# Patient Record
Sex: Female | Born: 1970 | Race: White | Hispanic: No | Marital: Married | Smoking: Former smoker
Health system: Southern US, Community
[De-identification: ages and names within clinical notes are randomized; demographics above are authoritative.]

## PROBLEM LIST (undated history)

## (undated) DIAGNOSIS — I10 Essential (primary) hypertension: Secondary | ICD-10-CM

## (undated) DIAGNOSIS — K219 Gastro-esophageal reflux disease without esophagitis: Secondary | ICD-10-CM

## (undated) DIAGNOSIS — L509 Urticaria, unspecified: Secondary | ICD-10-CM

## (undated) HISTORY — DX: Gastro-esophageal reflux disease without esophagitis: K21.9

## (undated) HISTORY — DX: Urticaria, unspecified: L50.9

## (undated) HISTORY — DX: Essential (primary) hypertension: I10

## (undated) HISTORY — PX: BREAST BIOPSY: SHX20

---

## 1990-01-27 HISTORY — PX: LAPAROSCOPY: SHX197

## 2001-02-08 ENCOUNTER — Other Ambulatory Visit: Admission: RE | Admit: 2001-02-08 | Discharge: 2001-02-08 | Payer: Self-pay | Admitting: *Deleted

## 2003-06-09 ENCOUNTER — Other Ambulatory Visit: Admission: RE | Admit: 2003-06-09 | Discharge: 2003-06-09 | Payer: Self-pay | Admitting: Obstetrics and Gynecology

## 2005-01-09 ENCOUNTER — Other Ambulatory Visit: Admission: RE | Admit: 2005-01-09 | Discharge: 2005-01-09 | Payer: Self-pay | Admitting: Obstetrics and Gynecology

## 2005-01-27 HISTORY — PX: MYOMECTOMY: SHX85

## 2005-02-06 ENCOUNTER — Ambulatory Visit: Payer: Self-pay | Admitting: Internal Medicine

## 2005-03-20 ENCOUNTER — Ambulatory Visit: Payer: Self-pay | Admitting: Internal Medicine

## 2005-04-29 ENCOUNTER — Other Ambulatory Visit: Admission: RE | Admit: 2005-04-29 | Discharge: 2005-04-29 | Payer: Self-pay | Admitting: Obstetrics and Gynecology

## 2005-09-04 ENCOUNTER — Inpatient Hospital Stay (HOSPITAL_COMMUNITY): Admission: RE | Admit: 2005-09-04 | Discharge: 2005-09-05 | Payer: Self-pay | Admitting: Obstetrics and Gynecology

## 2005-09-18 ENCOUNTER — Ambulatory Visit: Payer: Self-pay | Admitting: Internal Medicine

## 2006-06-17 ENCOUNTER — Other Ambulatory Visit: Admission: RE | Admit: 2006-06-17 | Discharge: 2006-06-17 | Payer: Self-pay | Admitting: Obstetrics and Gynecology

## 2006-10-07 ENCOUNTER — Telehealth (INDEPENDENT_AMBULATORY_CARE_PROVIDER_SITE_OTHER): Payer: Self-pay | Admitting: *Deleted

## 2006-12-02 ENCOUNTER — Encounter (INDEPENDENT_AMBULATORY_CARE_PROVIDER_SITE_OTHER): Payer: Self-pay | Admitting: *Deleted

## 2006-12-03 ENCOUNTER — Ambulatory Visit: Payer: Self-pay | Admitting: Internal Medicine

## 2006-12-03 DIAGNOSIS — I10 Essential (primary) hypertension: Secondary | ICD-10-CM

## 2006-12-03 LAB — CONVERTED CEMR LAB
BUN: 12 mg/dL (ref 6–23)
CO2: 31 meq/L (ref 19–32)
Calcium: 9.5 mg/dL (ref 8.4–10.5)
Chloride: 100 meq/L (ref 96–112)
Creatinine, Ser: 0.9 mg/dL (ref 0.4–1.2)

## 2006-12-17 ENCOUNTER — Ambulatory Visit: Payer: Self-pay | Admitting: Internal Medicine

## 2006-12-17 LAB — CONVERTED CEMR LAB: Potassium: 4.1 meq/L (ref 3.5–5.1)

## 2007-02-25 ENCOUNTER — Ambulatory Visit: Payer: Self-pay | Admitting: Family Medicine

## 2007-02-25 ENCOUNTER — Encounter (INDEPENDENT_AMBULATORY_CARE_PROVIDER_SITE_OTHER): Payer: Self-pay | Admitting: Internal Medicine

## 2007-02-25 DIAGNOSIS — R002 Palpitations: Secondary | ICD-10-CM

## 2007-06-16 ENCOUNTER — Ambulatory Visit: Payer: Self-pay | Admitting: Family Medicine

## 2007-06-16 DIAGNOSIS — N390 Urinary tract infection, site not specified: Secondary | ICD-10-CM

## 2007-06-16 LAB — CONVERTED CEMR LAB
Beta hcg, urine, semiquantitative: NEGATIVE
Bilirubin Urine: NEGATIVE
Nitrite: NEGATIVE
Protein, U semiquant: >=300
Specific Gravity, Urine: <1.005

## 2007-06-18 ENCOUNTER — Encounter (INDEPENDENT_AMBULATORY_CARE_PROVIDER_SITE_OTHER): Payer: Self-pay | Admitting: Internal Medicine

## 2008-03-16 ENCOUNTER — Ambulatory Visit: Payer: Self-pay | Admitting: Family Medicine

## 2009-03-28 ENCOUNTER — Telehealth: Payer: Self-pay | Admitting: Internal Medicine

## 2009-04-27 ENCOUNTER — Ambulatory Visit: Payer: Self-pay | Admitting: Internal Medicine

## 2009-04-27 DIAGNOSIS — J019 Acute sinusitis, unspecified: Secondary | ICD-10-CM

## 2009-05-03 ENCOUNTER — Ambulatory Visit: Payer: Self-pay | Admitting: Internal Medicine

## 2009-05-03 DIAGNOSIS — K219 Gastro-esophageal reflux disease without esophagitis: Secondary | ICD-10-CM

## 2009-06-01 ENCOUNTER — Ambulatory Visit: Payer: Self-pay | Admitting: Internal Medicine

## 2009-06-01 DIAGNOSIS — R740 Nonspecific elevation of levels of transaminase and lactic acid dehydrogenase [LDH]: Secondary | ICD-10-CM

## 2009-06-04 LAB — CONVERTED CEMR LAB
ALT: 46 units/L — ABNORMAL HIGH (ref 0–35)
AST: 36 units/L (ref 0–37)
Albumin: 4.4 g/dL (ref 3.5–5.2)
Alkaline Phosphatase: 80 units/L (ref 39–117)
Hep A IgM: NEGATIVE
Potassium: 3.2 meq/L — ABNORMAL LOW (ref 3.5–5.1)
Total Protein: 8 g/dL (ref 6.0–8.3)

## 2009-06-07 ENCOUNTER — Encounter: Payer: Self-pay | Admitting: Internal Medicine

## 2009-07-06 ENCOUNTER — Ambulatory Visit: Payer: Self-pay | Admitting: Internal Medicine

## 2009-11-01 ENCOUNTER — Ambulatory Visit: Payer: Self-pay | Admitting: Internal Medicine

## 2009-11-01 DIAGNOSIS — J029 Acute pharyngitis, unspecified: Secondary | ICD-10-CM

## 2009-11-01 LAB — CONVERTED CEMR LAB: Rapid Strep: NEGATIVE

## 2009-11-26 ENCOUNTER — Encounter: Payer: Self-pay | Admitting: Internal Medicine

## 2009-11-26 ENCOUNTER — Telehealth: Payer: Self-pay | Admitting: Internal Medicine

## 2010-02-24 LAB — CONVERTED CEMR LAB
AST: 38 units/L — ABNORMAL HIGH (ref 0–37)
Alkaline Phosphatase: 79 units/L (ref 39–117)
BUN: 15 mg/dL (ref 6–23)
Basophils Absolute: 0.1 10*3/uL (ref 0.0–0.1)
Bilirubin, Direct: 0.2 mg/dL (ref 0.0–0.3)
Chloride: 97 meq/L (ref 96–112)
Eosinophils Absolute: 0.1 10*3/uL (ref 0.0–0.7)
GFR calc non Af Amer: 83.91 mL/min (ref >60)
Glucose, Bld: 80 mg/dL (ref 70–99)
Hemoglobin: 15.9 g/dL — ABNORMAL HIGH (ref 12.0–15.0)
Lymphocytes Relative: 14 % (ref 12.0–46.0)
Monocytes Relative: 4.6 % (ref 3.0–12.0)
Neutro Abs: 10.2 10*3/uL — ABNORMAL HIGH (ref 1.4–7.7)
Neutrophils Relative %: 80.2 % — ABNORMAL HIGH (ref 43.0–77.0)
Phosphorus: 3.7 mg/dL (ref 2.3–4.6)
Potassium: 3.3 meq/L — ABNORMAL LOW (ref 3.5–5.1)
RBC: 4.83 M/uL (ref 3.87–5.11)
RDW: 13.1 % (ref 11.5–14.6)
TSH: 2.06 microintl units/mL (ref 0.35–5.50)

## 2010-02-26 NOTE — Medication Information (Signed)
Summary: Chlorthalidone/[facility]  Chlorthalidone/[facility]   Imported By: Lanelle Bal 12/03/2009 09:10:28  _____________________________________________________________________  External Attachment:    Type:   Image     Comment:   External Document

## 2010-02-26 NOTE — Assessment & Plan Note (Signed)
Summary: SORE THROAT X 4 DAYS/SINUS/DLO   Vital Signs:  Patient profile:   40 year old female Weight:      167.25 pounds Temp:     98.3 degrees F oral Pulse rate:   68 / minute Pulse rhythm:   regular BP sitting:   138 / 82  (left arm) Cuff size:   regular  Vitals Entered By: Selena Batten Dance CMA [facility] Dull) (November 01, 2009 11:37 AM) CC: Sore throat/sinus issues   History of Present Illness: CC: ST  4 d h/o ST, bad, trouble swallowing.  Also mild cough.  + sinus drainage and pressure, congestion.  No fevers/chills, abd pain/n/v, HA.  No RN.  No drooling, no trouble opening/closing mouth.  Tried alka seltzer, zinc tablets.  maybe a bit dizzy.  no h/o allergies.  No sick contacts at home.  + sick contacts at work ([employer])  No smokers at home.  Current Medications (verified): 1)  Chlorthalidone 25 Mg  Tabs (Chlorthalidone) .... Take 1 Tablet By Mouth Once A Day 2)  Omeprazole 20 Mg Tbec (Omeprazole) .Marland Kitchen.. 1 Tab Daily On An Empty Stomach  Allergies: 1)  ! 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   Head:  no frontal or maxillary tenderness Eyes:  pupils equal, pupils round, pupils reactive to light Ears:  R ear normal and L ear normal.   Nose:  moderate inflammation Mouth:  + erythema pharynx but no exudates Neck:  supple, no masses, no thyromegaly, no carotid bruits, and no cervical lymphadenopathy.   Lungs:  normal respiratory effort, normal breath sounds, no crackles, and no wheezes.   Heart:  normal rate, regular rhythm, no murmur, and no gallop.   Abdomen:   soft, non-tender, and no masses.   Pulses:  2+ rad pulses Extremities:  no c/c/e Skin:  no rashes and no suspicious lesions.     Impression & Recommendations:  Problem # 1:  SORE THROAT (ICD-462)  sounds viral URTI, possible early sinusitis.  Main complaint is ST, treat supportively.  red flags to return discussed.  to call on Monday (day 10 of illness) if lingering for script for abx (zpack) to prevent sinusitis from developing.  Orders: Rapid Strep (29528)  Complete Medication List: 1)  Chlorthalidone 25 Mg Tabs (Chlorthalidone) .... Take 1 tablet by mouth once a day 2)  Omeprazole 20 Mg Tbec (Omeprazole) .Marland Kitchen.. 1 tab daily on an empty stomach  Patient Instructions: 1)  Looks like a viral infection.  Wash hands to prevent spreading. 2)  Push fluids, get plenty of rest, ibuprofen (motrin) for sore throat.  Suck on cold things like popsicles, or heat to soothe throat like herbal teas, consider salt water gargles. 3)  Call us Monday if stil lingering despite trying these things and getting plenty of rest over weekend. 4)  If fever >101.5, trouble swallowing or breathing or opening mouth, drooling, or other concerns, you may need to return to be seen. 5)  Try nasal saline for congestion. 6)  Pleasure to see you today, call clinic with questions.   Current Allergies (reviewed today): ! LISINOPRIL  Laboratory Results  Other Tests  Rapid Strep: negative

## 2010-02-26 NOTE — Progress Notes (Signed)
Summary: refill request for chlorthalidone  Phone Note Refill Request   Refills Requested: Medication #1:  CHLORTHALIDONE 25 MG  TABS Take 1 tablet by mouth once a day Pt is requesting that [facility], form is on your desk.  Initial call taken by: Lowella Petties CMA, AAMA,  November 26, 2009 12:37 PM  Follow-up for Phone Call        okay for 1 year Follow-up by: Cindee Salt MD,  November 26, 2009 1:55 PM  Additional Follow-up for Phone Call Additional follow up Details #1::        [facility] Additional Follow-up by: Mervin Hack CMA [facility]),  November 26, 2009 2:28 PM    Prescriptions: CHLORTHALIDONE 25 MG  TABS (CHLORTHALIDONE) Take 1 tablet by mouth once a day  #90 x 3   Entered by:   Mervin Hack CMA (AAMA)   Authorized by:   Cindee Salt MD   Signed by:   Mervin Hack CMA (AAMA) on 11/26/2009   Method used:   Handwritten   RxID:   [id number]

## 2010-02-26 NOTE — Letter (Signed)
Summary: Physical Exam Form/[place] [facility]  Physical Exam Form/[place] [facility]   Imported By: Lanelle Bal 06/13/2009 11:35:27  _____________________________________________________________________  External Attachment:    Type:   Image     Comment:   External Document

## 2010-02-26 NOTE — Assessment & Plan Note (Signed)
Summary: CPX/CLE   Vital Signs:  Patient profile:   40 year old female Weight:      159 pounds Temp:     98.7 degrees F oral Pulse rate:   106 / minute Pulse rhythm:   regular BP sitting:   122 / 86  (left arm) Cuff size:   regular  Vitals Entered By: Mervin Hack CMA [facility]) (May 03, 2009 2:22 PM) CC: adult physical   History of Present Illness: Feeling better Needs PE for foster parenting status hopes to foster children in the near future  Stopped smoking several years ago still feels her lung capacity isn't great---over the past 1.5 years limited exercise tolerance fairly sedentary though--spurts of walking regularly but nothing consistent occ cough No history of asthma  Still sees gynecologist is considering first mammo  Allergies: 1)  ! Lisinopril  Past History:  Past medical, surgical, family and social histories (including risk factors) reviewed for relevance to current acute and chronic problems.  Past Medical History: Hypertension Endometriosis GERD  [facility]  [phone]  Past Surgical History: Reviewed history from 12/09/2006 and no changes required. Laparoscopy- endometriosis (1992) Fibroids removed (2007)  Family History: Reviewed history from 12/09/2006 and no changes required. Parents healthy 2 brothers Dennie Bible GF died of throat cancer CAD in Pat and mat GM HTN and DM on dad's side No breast or colon cancer  Social History: Reviewed history from 04/27/2009 and no changes required. 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 Better with nasal strip Wears seat belt. Eyes:  Denies double vision and vision loss-1  eye. ENT:  Denies decreased hearing and ringing in ears; teeth okay--due for dentist. CV:  Complains of palpitations and shortness of breath with exertion; denies chest pain or discomfort, difficulty breathing at night, difficulty breathing while lying down, fainting, and near fainting. Resp:  Complains of cough; denies shortness of breath. GI:  Complains of indigestion; denies nausea and vomiting; lots of heartburn----uses zantac about 3 times per week but near daily symptoms. THis does help Occ voice changes. GU:  Denies dysuria and incontinence; no sexual problems. MS:  Denies joint pain and joint swelling. Derm:  Denies lesion(s) and rash. Neuro:  Denies headaches, numbness, tingling, and weakness. Psych:  Complains of anxiety; denies depression; stress--relates to being unemployed.. Heme:  Denies abnormal bruising and enlarge lymph nodes. Allergy:  Denies seasonal allergies and sneezing.  Physical Exam  General:  alert and normal appearance.   Eyes:  pupils equal, pupils round, pupils reactive to light, and no optic disk abnormalities.   Ears:  R ear normal and L ear normal.   Mouth:  no erythema, no exudates, and no lesions.   Neck:  supple, no masses, no thyromegaly, no carotid bruits, and no cervical lymphadenopathy.   Lungs:  normal respiratory effort, normal breath sounds, no crackles, and no wheezes.   Heart:  normal rate, regular rhythm, no murmur, and no gallop.   Abdomen:  soft, non-tender, and no masses.   Msk:  no joint tenderness and no joint swelling.   Pulses:  1+ in feet Extremities:  no edema Neurologic:  alert & oriented X3, strength normal in all extremities, and gait normal.   Skin:  no rashes and no suspicious lesions.   Axillary Nodes:  No palpable lymphadenopathy Psych:  normally interactive, good eye contact, not anxious appearing, and not depressed appearing.   Additional Exam:  Spirometry normal CXR normal   Impression & Recommendations:  Problem #  1:  WELL ADULT EXAM (ICD-V70.0) Assessment Comment Only generally healthy should work on fitness okay for foster parenting  Problem # 2:  COUGH (ICD-786.2) Assessment: New most likely related to GERD will start omeprazole 20mg  daily recheck if not improved Orders: CXR- 2view (CXR) Spirometry w/Graph (94010)  Problem # 3:  PALPITATIONS (ICD-785.1) Assessment: Comment Only intermittent seems benign Orders: EKG w/ Interpretation (93000)  Problem # 4:  HYPERTENSION (ICD-401.9) Assessment: Unchanged good control no changes needed  Her updated medication list for this problem includes:    Chlorthalidone 25 Mg Tabs (Chlorthalidone) .Marland Kitchen... Take 1 tablet by mouth once a day  Orders: TLB-Renal Function Panel (80069-RENAL) TLB-CBC Platelet - w/Differential (85025-CBCD) TLB-Hepatic/Liver Function Pnl (80076-HEPATIC) TLB-TSH (Thyroid Stimulating Hormone) (84443-TSH) Venipuncture (91478)  BP today: 122/86 Prior BP: 130/90 (04/27/2009)  Labs Reviewed: K+: 4.1 (12/17/2006) Creat: : 0.9 (12/03/2006)     Complete Medication List: 1)  Chlorthalidone 25 Mg Tabs (Chlorthalidone) .... Take 1 tablet by mouth once a day 2)  Amoxicillin 500 Mg Tabs (Amoxicillin) .... 2 tabs by mouth two times a day for sinus infection 3)  Omeprazole 20 Mg Tbec (Omeprazole) .Marland Kitchen.. 1 tab daily on an empty stomach  Patient Instructions: 1)  Please schedule a follow-up appointment in 6 months .  2)  call if your cough is not better in 1-2 months 3)  Start over the counter omeprazole (generic of prilosec)  Current Allergies (reviewed today): ! LISINOPRIL   EKG  Procedure date:  05/03/2009  Findings:      sinus @94  non diagnostic Q in lead III otherwise normal no change from 2009

## 2010-02-26 NOTE — Progress Notes (Signed)
Summary: Pt needs appt  Phone Note Outgoing Call Call back at [facility] Phone [phone]   Call placed by: DeShannon Katrinka Blazing CMA [facility]),  March 28, 2009 3:30 PM Call placed to: Patient Summary of Call: lmom for patient to return my call. Received E-Scribe Request for CHLORTHALIDONE 25 MG TABLET, pt has not been seen since 2009, need pt to schedule appt in order to get refill. Initial call taken by: Mervin Hack CMA [facility]),  March 28, 2009 3:31 PM  Follow-up for Phone Call        patient called back and advised that she is un-employed and that she does have a $7000.00 deductable with [employer] and she's not sure if they will cover a physical or follow-up. I advised pt to call her insurance and see what is covered and that I would refill x2. Pt states she will call insurance and then call to schedule appt here. DeShannon Smith CMA [facility])  March 28, 2009 4:36 PM   Additional Follow-up for Phone Call Additional follow up Details #1::        Let her know that we have fee reduction system if she qualifies and I will reduce the charge otherwise but I cannot continue to refill the Rx without a visit. The OV shoudl be less than $100 at worst if she still has no insurance---then I can do Rx for a year Additional Follow-up by: Cindee Salt MD,  March 28, 2009 7:20 PM    Additional Follow-up for Phone Call Additional follow up Details #2::    ok, I'll wait for patient to call back. Follow-up by: Mervin Hack CMA [facility] Dull),  March 29, 2009 9:03 AM

## 2010-02-26 NOTE — Assessment & Plan Note (Signed)
Summary: COUGH,SINUS PRESSURE,CONGESTION/   Vital Signs:  Patient profile:   40 year old female Weight:      163 pounds BMI:     27.65 Temp:     98.6 degrees F oral Pulse rate:   121 / minute Pulse rhythm:   regular BP sitting:   130 / 90  (left arm) Cuff size:   regular  Vitals Entered By: Mervin Hack CMA [facility] Dull) (April 27, 2009 3:15 PM) CC: cough/sinus   History of Present Illness: Having frontal pressure and sinus headache ears stopped up Throat is sore Some PND and nasal drainage No fever Some SOB when exerting herself  Some help with tylenol cold and sinus  No night sweats, chills No shakes  symptoms started 3-4 days ago Last cold she got sick with bronchitis after waiting has important interview next week ( she has been out of work for a year)  Allergies: 1)  ! Lisinopril  Past History:  Past medical, surgical, family and social histories (including risk factors) reviewed for relevance to current acute and chronic problems.  Past Medical History: Reviewed history from 12/03/2006 and no changes required. Hypertension Endometriosis  [facility]  [phone]  Past Surgical History: Reviewed history from 12/09/2006 and no changes required. Laparoscopy- endometriosis (1992) Fibroids removed (2007)  Family History: Reviewed history from 12/09/2006 and no changes required. Parents healthy 2 brothers Dennie Bible GF died of throat cancer CAD in [place] and mat GM HTN and DM on dad's side No breast or colon cancer  Social History: Reviewed history from 02/25/2007 and no changes required. 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  NAD Head:  no frontal or maxillary tenderness Ears:  R ear normal and L ear normal.   Nose:  moderate  inflammation slight yellow discharge from left Mouth:  slight injeciton in pharynx without exudate Neck:  supple, no masses, and no cervical lymphadenopathy.   Lungs:  normal respiratory effort, normal breath sounds, no crackles, and no wheezes.     Impression & Recommendations:  Problem # 1:  SINUSITIS - ACUTE-NOS (ICD-461.9) Assessment New  not clear if viral or bacterial but concerned about worsening--esp in view of her improtant interview will go ahead and treat  Her updated medication list for this problem includes:    Amoxicillin 500 Mg Tabs (Amoxicillin) .Marland Kitchen... 2 tabs by mouth two times a day for sinus infection  Complete Medication List: 1)  Chlorthalidone 25 Mg Tabs (Chlorthalidone) .... Take 1 tablet by mouth once a day 2)  Amoxicillin 500 Mg Tabs (Amoxicillin) .... 2 tabs by mouth two times a day for sinus infection  Patient Instructions: 1)  Please schedule a follow-up appointment as needed .  Prescriptions: AMOXICILLIN 500 MG TABS (AMOXICILLIN) 2 tabs by mouth two times a day for sinus infection  #40 x 0   Entered and Authorized by:   Cindee Salt MD   Signed by:   Cindee Salt MD on 04/27/2009   Method used:   Electronically to        [facility]  [address] (retail)       [address]       [place], [place]  [address]       Ph: [phone] or [phone]  Fax: [phone]   RxID:   [id number]   Current Allergies (reviewed today): ! LISINOPRIL

## 2010-05-17 ENCOUNTER — Encounter: Payer: Self-pay | Admitting: Internal Medicine

## 2010-05-21 ENCOUNTER — Ambulatory Visit (INDEPENDENT_AMBULATORY_CARE_PROVIDER_SITE_OTHER): Payer: 59 | Admitting: Internal Medicine

## 2010-05-21 ENCOUNTER — Encounter: Payer: Self-pay | Admitting: Internal Medicine

## 2010-05-21 VITALS — BP 130/80 | HR 80 | Temp 98.6°F | Ht 64.0 in | Wt 170.0 lb

## 2010-05-21 DIAGNOSIS — K219 Gastro-esophageal reflux disease without esophagitis: Secondary | ICD-10-CM

## 2010-05-21 DIAGNOSIS — I10 Essential (primary) hypertension: Secondary | ICD-10-CM

## 2010-05-21 DIAGNOSIS — R7301 Impaired fasting glucose: Secondary | ICD-10-CM

## 2010-05-21 DIAGNOSIS — Z0000 Encounter for general adult medical examination without abnormal findings: Secondary | ICD-10-CM

## 2010-05-21 LAB — BASIC METABOLIC PANEL
CO2: 29 mEq/L (ref 19–32)
Calcium: 9.8 mg/dL (ref 8.4–10.5)
Creatinine, Ser: 0.9 mg/dL (ref 0.4–1.2)
GFR: 73.82 mL/min (ref >60.00)
Sodium: 136 mEq/L (ref 135–145)

## 2010-05-21 LAB — HEMOGLOBIN A1C: Hgb A1c MFr Bld: 5.7 % (ref 4.6–6.5)

## 2010-05-21 NOTE — Progress Notes (Signed)
Subjective:    Patient ID: Madeline Reid, female    DOB: April 20, 1970, 40 y.o.   MRN: [id number]  HPI Got a job in October Applied for extra insurance and was denied Had a non fasting glucose of 114 LFTs were borderline--- ALT 46 (normal up to 45), AST normal. GGTP was high---non specific  BP has been okay Still on med  ON prilosec  Generally controls heartburn  Has appt with gyn in [place] [facility] that she has seen for years No mammo yet  Current outpatient prescriptions:chlorthalidone (HYGROTON) 25 MG tablet, Take 25 mg by mouth daily.  , Disp: , Rfl: ;  omeprazole (PRILOSEC) 20 MG capsule, Take 20 mg by mouth daily.  , Disp: , Rfl:   Past Medical History  Diagnosis Date  . Hypertension   . GERD (gastroesophageal reflux disease)   . Endometriosis     Past Surgical History  Procedure Date  . Laparoscopy 1992    endometriosis  . Myomectomy 2007    removed    Family History  Problem Relation Age of Onset  . Coronary artery disease Maternal Grandmother   . Coronary artery disease Paternal Grandmother   . Cancer Paternal Grandfather     throat cancer  . Colon cancer Neg Hx   . Breast cancer Neg Hx     History   Social History  . Marital Status: Married    Spouse Name: N/A    Number of Children: N/A  . Years of Education: N/A   Occupational History  . 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 Smoking status: Former Smoker    Types: Cigarettes    Quit date: 08/28/2006  . Smokeless tobacco: Not on file  . Alcohol Use: Yes  . Drug Use: Not on file  . Sexually Active: Not on file   Other Topics Concern  . 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       Weight up 11# in past year Eats poorly Does do some exercise Is foster parenting 40 year old  HENT: Negative for hearing loss, congestion,  rhinorrhea, dental problem, postnasal drip and tinnitus.        Reestablished with dentist now that she is back at work  Eyes: Negative for visual disturbance.       No diplopia or vision loss  Respiratory: Positive for shortness of breath. Negative for cough and chest tightness.        Stable DOE---occ even runs out of breath when talking Resolves quickly No real problems exercising though Cough controlled on prilosec  Cardiovascular: Negative for chest pain, palpitations and leg swelling.  Gastrointestinal: Negative for nausea, vomiting, abdominal pain, constipation and blood in stool.       Heartburn controlled unless forgets prilosec  Genitourinary: Negative for dysuria, frequency, difficulty urinating, menstrual problem and dyspareunia.  Musculoskeletal: Negative for myalgias, back pain, joint swelling and arthralgias.  Skin: Negative for rash.       No suspicious lesions  Neurological: Negative for dizziness, syncope, weakness and headaches.       Occ uncomfortable feeling in left hand--no specific hand RIght hand for mouse  Hematological: Negative for adenopathy. Does not bruise/bleed easily.  Psychiatric/Behavioral: Positive for sleep disturbance. Negative for dysphoric mood. The patient is not nervous/anxious.        Occ sleep  problems---awakens and can't get back to sleep No sig daytime somnolence       Objective:   Physical Exam  Constitutional: She is oriented to person, place, and time. She appears well-developed and well-nourished. No distress.  HENT:  Head: Normocephalic and atraumatic.  Right Ear: External ear normal.  Left Ear: External ear normal.  Mouth/Throat: Oropharynx is clear and moist. No oropharyngeal exudate.       TMs negative  Eyes: Conjunctivae and EOM are normal. Pupils are equal, round, and reactive to light.       Fundi benign  Neck: Normal range of motion. Neck supple. No thyromegaly present.  Cardiovascular: Normal rate, regular rhythm, normal  heart sounds and intact distal pulses.  Exam reveals no gallop.   No murmur heard. Pulmonary/Chest: Effort normal and breath sounds normal. No respiratory distress. She has no wheezes. She has no rales.  Abdominal: Soft. She exhibits no mass. There is no tenderness.  Musculoskeletal: Normal range of motion. She exhibits no edema and no tenderness.  Lymphadenopathy:    She has no cervical adenopathy.    She has no axillary adenopathy.  Neurological: She is alert and oriented to person, place, and time. She exhibits normal muscle tone.       No focal weakness  Skin: Skin is warm. No rash noted.       No suspicious lesions Multiple benign nevi--esp legs Small ?lipoma on mid volar left forearm  Psychiatric: She has a normal mood and affect. Her behavior is normal. Judgment and thought content normal.          Assessment & Plan:

## 2010-05-24 ENCOUNTER — Encounter: Payer: Self-pay | Admitting: Internal Medicine

## 2010-07-18 ENCOUNTER — Ambulatory Visit (INDEPENDENT_AMBULATORY_CARE_PROVIDER_SITE_OTHER): Payer: 59 | Admitting: Family Medicine

## 2010-07-18 ENCOUNTER — Encounter: Payer: Self-pay | Admitting: Family Medicine

## 2010-07-18 VITALS — BP 160/102 | HR 80 | Temp 99.0°F | Wt 160.0 lb

## 2010-07-18 DIAGNOSIS — I10 Essential (primary) hypertension: Secondary | ICD-10-CM

## 2010-07-18 NOTE — Assessment & Plan Note (Addendum)
Check BMET- the sx may be related to low K- and have pt check BP at home after voiding.  No change in meds for now.  D/w pt.  She understood.  See notes on labs when resulted.   Addendum.  K is 3.2, Cr 0.8.  Start KDur a day and recheck BMET in 1-2 weeks.  Dx 401.1  Handwritten order.

## 2010-07-18 NOTE — Progress Notes (Signed)
Hypertension:    Using medication without problems or lightheadedness: yes, see below Chest pain with exertion:no Edema:no Short of breath:no Average home BPs: elevated at home recently.   Other issues: nonspecific feeling of heaviness in legs bilaterally over the last week but no edema.   She had stopped the chlorthalidone (after working on diet and BP was low) but had some edema.  Started back on the medicine and the edema had resolved.    Meds, vitals, and allergies reviewed.   ROS: See HPI.  Otherwise negative.    GEN: nad, alert and oriented HEENT: mucous membranes moist NECK: supple w/o LA CV: rrr. PULM: ctab, no inc wob ABD: soft, +bs EXT: no edema SKIN: no acute rash

## 2010-07-18 NOTE — Patient Instructions (Signed)
You can get your results through our phone system.  Follow the instructions on the blue card. Don't change your meds.  Check your pressure at home in the morning after you get out of bed and after you urinate.  Let me know about the numbers.  Take care.

## 2010-07-19 ENCOUNTER — Other Ambulatory Visit: Payer: Self-pay | Admitting: *Deleted

## 2010-07-19 MED ORDER — POTASSIUM CHLORIDE CRYS ER 20 MEQ PO TBCR: 20.0000 meq | EXTENDED_RELEASE_TABLET | Freq: Two times a day (BID) | ORAL | Status: DC

## 2010-07-19 NOTE — Telephone Encounter (Signed)
Called patient with labs results. Rx for labs work left up front for patient to pick up. Rx for kdur sent to pharmacy. Copy of labs sent for scanning.

## 2010-09-18 ENCOUNTER — Other Ambulatory Visit: Payer: Self-pay | Admitting: Internal Medicine

## 2010-09-18 NOTE — Telephone Encounter (Signed)
rx sent to pharmacy by e-script  

## 2010-09-18 NOTE — Telephone Encounter (Signed)
This is not controlled  It should be refilled for a year

## 2010-10-21 ENCOUNTER — Encounter: Payer: Self-pay | Admitting: Internal Medicine

## 2010-10-21 ENCOUNTER — Ambulatory Visit (INDEPENDENT_AMBULATORY_CARE_PROVIDER_SITE_OTHER): Payer: 59 | Admitting: Internal Medicine

## 2010-10-21 DIAGNOSIS — J019 Acute sinusitis, unspecified: Secondary | ICD-10-CM

## 2010-10-21 DIAGNOSIS — I10 Essential (primary) hypertension: Secondary | ICD-10-CM

## 2010-10-21 DIAGNOSIS — K219 Gastro-esophageal reflux disease without esophagitis: Secondary | ICD-10-CM

## 2010-10-21 MED ORDER — AMOXICILLIN 500 MG PO TABS: 1000.0000 mg | ORAL_TABLET | Freq: Two times a day (BID) | ORAL | Status: AC

## 2010-10-21 NOTE — Progress Notes (Signed)
Subjective:    Patient ID: Madeline Reid, female    DOB: 1970-07-07, 40 y.o.   MRN: [id number]  HPI Feels a "little under the weather" Has ?sinus infection for about a week Doesn't think she has ragweed allergies Has pain in forehead and behind eyes Some congestion now and drainage Sore throat which seems to be due to drainage Dry aggravating cough--mostly at night  Using afrin bid and decongestant No fever or slight Slight SOB No ear pain but is congested  Has checked BP occ Usually 130/80---occ higher later in the day Has been intermittent with the potassium---OTC had no coating and Rx is very big  Stomach has been quiet lately Hasn't needed the prilosec lately  Current Outpatient Prescriptions on File Prior to Visit  Medication Sig Dispense Refill  . chlorthalidone (HYGROTON) 25 MG tablet TAKE 1 TABLET BY MOUTH ONCE A DAY  30 tablet  11  . omeprazole (PRILOSEC) 20 MG capsule Take 20 mg by mouth daily.        . potassium chloride SA (K-DUR,KLOR-CON) 20 MEQ tablet Take 1 tablet (20 mEq total) by mouth 2 (two) times daily.  30 tablet  5    Allergies  Allergen Reactions  . Lisinopril     Potential cough    Past Medical History  Diagnosis Date  . Hypertension   . GERD (gastroesophageal reflux disease)   . Endometriosis     Past Surgical History  Procedure Date  . Laparoscopy 1992    endometriosis  . Myomectomy 2007    removed    Family History  Problem Relation Age of Onset  . Coronary artery disease Maternal Grandmother   . Coronary artery disease Paternal Grandmother   . Cancer Paternal Grandfather     throat cancer  . Colon cancer Neg Hx   . Breast cancer Neg Hx     History   Social History  . Marital Status: Married    Spouse Name: N/A    Number of Children: N/A  . Years of Education: N/A   Occupational History  . 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 Smoking status: Former Smoker    Types: Cigarettes    Quit date:  08/28/2006  . Smokeless tobacco: Not on file  . Alcohol Use: Yes  . Drug Use: Not on file  . Sexually Active: Not on file   Other Topics Concern  . 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 No distress.  HENT:  Head: Normocephalic and atraumatic.  Right Ear: External ear normal.  Left Ear: External ear normal.  Mouth/Throat: Oropharynx is clear and moist. No oropharyngeal exudate.       No sinus tenderness Moderate nasal congestion and inflammation  Neck: Normal range of motion. Neck supple.  Cardiovascular: Normal rate, regular rhythm and normal heart sounds.  Exam reveals no gallop.   No murmur heard. Pulmonary/Chest: Effort normal and breath sounds normal. No respiratory distress. She has no wheezes. She has no rales.  Musculoskeletal: Normal range of motion. She exhibits no edema and no tenderness.  Lymphadenopathy:    She has no cervical adenopathy.  Psychiatric: She has a normal mood and affect. Her behavior is normal. Judgment and thought content normal.  Assessment & Plan:

## 2010-10-21 NOTE — Assessment & Plan Note (Signed)
Seems to be viral Discussed symptomatic rx (including limiting afrin) If worsens, start the amoxicillin (Rx given)

## 2010-10-21 NOTE — Assessment & Plan Note (Signed)
BP Readings from Last 3 Encounters:  10/21/10 144/90  07/18/10 160/102  05/21/10 130/80   Better Seems okay at home Persistent hypokalemia and doesn't tolerate potassium Will change to HCTZ/triam if still low  Has been on decongestants so probably at goal now

## 2010-10-21 NOTE — Assessment & Plan Note (Signed)
Better since on Atkin's Hasn't needed meds

## 2010-10-24 ENCOUNTER — Telehealth: Payer: Self-pay | Admitting: *Deleted

## 2010-10-24 ENCOUNTER — Encounter: Payer: Self-pay | Admitting: *Deleted

## 2010-10-24 MED ORDER — TRIAMTERENE-HCTZ 37.5-25 MG PO TABS: 1.0000 | ORAL_TABLET | Freq: Every day | ORAL | Status: DC

## 2010-10-24 NOTE — Telephone Encounter (Signed)
.  left message to have patient return my call.  

## 2010-10-24 NOTE — Telephone Encounter (Signed)
Message copied by Sueanne Margarita on Thu Oct 24, 2010 10:57 AM ------      Message from: Tillman Abide I      Created: Wed Oct 23, 2010  8:03 AM       Please call      The kidney tests are fine but the potassium is still low. Please stop the chlorthalidone (and she is not taking the potassium). Start HCTZ/triamt 25/37.5 daily. (1 year Rx). Set up met B in about 1 month to check potassium again      Liver tests show one borderline elevated--rest are normal. This is not concerning      Chol is excellent with total of 173 and LDL or bad chol of 83

## 2010-10-24 NOTE — Telephone Encounter (Signed)
Patient called back and I advised results, rx sent to pharmacy, letter mailed to patients home address with results.

## 2011-04-15 ENCOUNTER — Ambulatory Visit (INDEPENDENT_AMBULATORY_CARE_PROVIDER_SITE_OTHER): Payer: 59 | Admitting: Internal Medicine

## 2011-04-15 ENCOUNTER — Encounter: Payer: Self-pay | Admitting: Internal Medicine

## 2011-04-15 VITALS — BP 118/80 | HR 74 | Temp 98.3°F | Ht 65.0 in | Wt 144.0 lb

## 2011-04-15 DIAGNOSIS — K219 Gastro-esophageal reflux disease without esophagitis: Secondary | ICD-10-CM

## 2011-04-15 DIAGNOSIS — I10 Essential (primary) hypertension: Secondary | ICD-10-CM

## 2011-04-15 NOTE — Progress Notes (Signed)
  Subjective:    Patient ID: Madeline Reid, female    DOB: [birth year], 41 y.o.   MRN: [id number]  HPI Here for physical but is too soon  Is due for gyn exam Will discuss mammogram with [facility] gyn  Still on BP med No problems with this Checks it occ and has been fine No headaches No chest pain No SOB No edema  Exercises regularly Has lost a few pounds  No trouble with acid reflux Low carb diet seems to have helped No meds  Current Outpatient Prescriptions on File Prior to Visit  Medication Sig Dispense Refill  . triamterene-hydrochlorothiazide (MAXZIDE-25) 37.5-25 MG per tablet Take 1 each (1 tablet total) by mouth daily.  90 tablet  3    Allergies  Allergen Reactions  . Lisinopril     Potential cough    Past Medical History  Diagnosis Date  . Hypertension   . GERD (gastroesophageal reflux disease)   . Endometriosis     Past Surgical History  Procedure Date  . Laparoscopy 1992    endometriosis  . Myomectomy 2007    removed    Family History  Problem Relation Age of Onset  . Coronary artery disease Maternal Grandmother   . Coronary artery disease Paternal Grandmother   . Cancer Paternal Grandfather     throat cancer  . Colon cancer Neg Hx   . Breast cancer Neg Hx     History   Social History  . Marital Status: Married    Spouse Name: N/A    Number of Children: N/A  . Years of Education: N/A   Occupational History  . 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 Smoking status: Former Smoker    Types: Cigarettes    Quit date: 08/28/2006  . Smokeless tobacco: Never Used  . Alcohol Use: Yes  . Drug Use: Not on file  . Sexually Active: Not on file   Other Topics Concern  . Not on file   Social History Narrative   Is a  foster parentWill be formally adopting 2 children they have been fostering   Review of Systems Sleeps great Bowels are fine No urinary problems Has lost about 25# since last year     Objective:   Physical Exam  Constitutional: She appears well-developed and well-nourished. No distress.  Neck: Normal range of motion. Neck supple.  Cardiovascular: Normal rate, regular rhythm, normal heart sounds and intact distal pulses.  Exam reveals no gallop.   No murmur heard. Pulmonary/Chest: Effort normal and breath sounds normal. No respiratory distress. She has no wheezes. She has no rales.  Abdominal: Soft. There is no tenderness.  Musculoskeletal: She exhibits no edema and no tenderness.  Lymphadenopathy:    She has no cervical adenopathy.  Psychiatric: She has a normal mood and affect. Her behavior is normal. Judgment and thought content normal.          Assessment & Plan:

## 2011-04-15 NOTE — Assessment & Plan Note (Signed)
Much better with dietary changes No meds needed

## 2011-04-15 NOTE — Assessment & Plan Note (Signed)
BP Readings from Last 3 Encounters:  04/15/11 118/80  10/21/10 144/90  07/18/10 160/102   Good control Has done great with weight loss No changes needed in meds

## 2011-04-16 ENCOUNTER — Encounter: Payer: Self-pay | Admitting: *Deleted

## 2011-04-16 LAB — BASIC METABOLIC PANEL
Calcium: 10 mg/dL (ref 8.7–10.2)
Chloride: 95 mmol/L — ABNORMAL LOW (ref 97–108)
Glucose: 86 mg/dL (ref 65–99)
Potassium: 3.7 mmol/L (ref 3.5–5.2)

## 2011-04-16 LAB — CBC WITH DIFFERENTIAL/PLATELET
Basophils Absolute: 0 10*3/uL (ref 0.0–0.2)
Eosinophils Absolute: 0.1 10*3/uL (ref 0.0–0.4)
Immature Grans (Abs): 0 10*3/uL (ref 0.0–0.1)
Immature Granulocytes: 0 % (ref 0–2)
Lymphs: 24 % (ref 14–46)
MCH: 31.3 pg (ref 26.6–33.0)
MCHC: 33.3 g/dL (ref 31.5–35.7)
Neutrophils Relative %: 66 % (ref 40–74)

## 2011-04-16 LAB — HEPATIC FUNCTION PANEL
ALT: 52 IU/L — ABNORMAL HIGH (ref 0–32)
Bilirubin, Direct: 0.13 mg/dL (ref 0.00–0.40)

## 2011-04-16 LAB — TSH: TSH: 2.13 u[IU]/mL (ref 0.450–4.500)

## 2011-04-16 LAB — LIPID PANEL
Chol/HDL Ratio: 3.4 ratio units (ref 0.0–4.4)
LDL Calculated: 121 mg/dL — ABNORMAL HIGH (ref 0–99)

## 2011-11-13 ENCOUNTER — Other Ambulatory Visit: Payer: Self-pay | Admitting: Internal Medicine

## 2011-11-26 ENCOUNTER — Other Ambulatory Visit: Payer: Self-pay

## 2011-11-26 NOTE — Telephone Encounter (Signed)
Pt request refill triamterene HCTZ to [facility]. [facility]; pt notified while on phone.

## 2012-03-03 ENCOUNTER — Encounter: Payer: Self-pay | Admitting: Family Medicine

## 2012-03-03 ENCOUNTER — Ambulatory Visit (INDEPENDENT_AMBULATORY_CARE_PROVIDER_SITE_OTHER): Payer: 59 | Admitting: Family Medicine

## 2012-03-03 ENCOUNTER — Telehealth: Payer: Self-pay | Admitting: Internal Medicine

## 2012-03-03 VITALS — BP 142/96 | HR 93 | Temp 99.0°F | Ht 65.0 in | Wt 139.5 lb

## 2012-03-03 DIAGNOSIS — B9689 Other specified bacterial agents as the cause of diseases classified elsewhere: Secondary | ICD-10-CM

## 2012-03-03 DIAGNOSIS — J029 Acute pharyngitis, unspecified: Secondary | ICD-10-CM

## 2012-03-03 DIAGNOSIS — J019 Acute sinusitis, unspecified: Secondary | ICD-10-CM | POA: Insufficient documentation

## 2012-03-03 LAB — POCT RAPID STREP A (OFFICE): Rapid Strep A Screen: NEGATIVE

## 2012-03-03 MED ORDER — AMOXICILLIN-POT CLAVULANATE 875-125 MG PO TABS: 1.0000 | ORAL_TABLET | Freq: Two times a day (BID) | ORAL | Status: DC

## 2012-03-03 NOTE — Assessment & Plan Note (Signed)
With facial pain and fever and purulent nasal drainage after a cold tx with augmentin Disc symptomatic care - see instructions on AVS  Update if not starting to improve in a week or if worsening

## 2012-03-03 NOTE — Patient Instructions (Addendum)
Take augmentin for sinus infection  Use nasal saline spray for congestion  Ibuprofen may help congestion and facial pain and fever - take with food  Update if not starting to improve in a week or if worsening

## 2012-03-03 NOTE — Telephone Encounter (Signed)
Patient Information:  Caller Name: Cadynce  Phone: [phone]  Patient: Madeline Reid, Madeline Reid  Gender: Female  DOB: 05-Sep-1970  Age: 42 Years  PCP: Tillman Abide [facility])  Pregnant: No  Office Follow Up:  Does the office need to follow up with this patient?: No  Instructions For The Office: N/A   Symptoms  Reason For Call & Symptoms: Patient states she has been sick with sore throat, cough, congestion, afebrile.  Reviewed Health History In EMR: Yes  Reviewed Medications In EMR: Yes  Reviewed Allergies In EMR: Yes  Reviewed Surgeries / Procedures: Yes  Date of Onset of Symptoms: 02/25/2012  Treatments Tried: Mucinex  Treatments Tried Worked: No OB / GYN:  LMP: 02/01/2012  Guideline(s) Used:  Breathing Difficulty  Sinus Pain and Congestion  Disposition Per Guideline:   See Today or Tomorrow in Office  Reason For Disposition Reached:   Patient wants to be seen  Advice Given:  N/A  Appointment Scheduled:  03/03/2012 11:45:00 Appointment Scheduled Provider:  Roxy Manns [facility])

## 2012-03-03 NOTE — Progress Notes (Signed)
Subjective:    Patient ID: Madeline Reid, female    DOB: October 21, 1970, 42 y.o.   MRN: [id number]  HPI 42 y.o. Here with sinus symptoms and ST   Rapid strep test neg  Started last wed with a fairly severe ST -thought it was from congestion/drip and sinus problems- was mouth breathing  Got worse and then a bit better on Sunday By Monday night- much worse - with new chest congestion   Cough is non prod but rattling  Started taking mucinex on Monday night   Painful face- even in her teeth  Thinks she may have a sinus infection  Low grade fever Green nasal d/c   Patient Active Problem List  Diagnosis  . HYPERTENSION  . GERD  . NONSPEC ELEVATION OF LEVELS OF TRANSAMINASE/LDH  . Routine general medical examination at a health care facility   Past Medical History  Diagnosis Date  . Hypertension   . GERD (gastroesophageal reflux disease)   . Endometriosis    Past Surgical History  Procedure Date  . Laparoscopy 1992    endometriosis  . Myomectomy 2007    removed   History  Substance Use Topics  . Smoking status: Former Smoker    Types: Cigarettes    Quit date: 08/28/2006  . Smokeless tobacco: Never Used  . Alcohol Use: Yes     Comment: occ   Family History  Problem Relation Age of Onset  . Coronary artery disease Maternal Grandmother   . Coronary artery disease Paternal Grandmother   . Cancer Paternal Grandfather     throat cancer  . Colon cancer Neg Hx   . Breast cancer Neg Hx    Allergies  Allergen Reactions  . Lisinopril     Potential cough   Current Outpatient Prescriptions on File Prior to Visit  Medication Sig Dispense Refill  . triamterene-hydrochlorothiazide (MAXZIDE-25) 37.5-25 MG per tablet TAKE 1 TABLET BY MOUTH ONCE A DAY  90 tablet  2  . [DISCONTINUED] omeprazole (PRILOSEC) 20 MG capsule Take 20 mg by mouth daily.            Review of Systems Review of Systems  Constitutional: Negative for  appetite change,  and unexpected weight change.   ENT pos for cong and st and sinus pain  Eyes: Negative for pain and visual disturbance.  Respiratory: Negative for wheeze and shortness of breath.   Cardiovascular: Negative for cp or palpitations    Gastrointestinal: Negative for nausea, diarrhea and constipation.  Genitourinary: Negative for urgency and frequency.  Skin: Negative for pallor or rash   Neurological: Negative for weakness, light-headedness, numbness and headaches.  Hematological: Negative for adenopathy. Does not bruise/bleed easily.  Psychiatric/Behavioral: Negative for dysphoric mood. The patient is not nervous/anxious.         Objective:   Physical Exam  Constitutional: She appears well-developed and well-nourished. No distress.  HENT:  Head: Normocephalic and atraumatic.  Right Ear: External ear normal.  Left Ear: External ear normal.  Mouth/Throat: Oropharynx is clear and moist. No oropharyngeal exudate.       Nares are injected and congested   Bilateral maxillary and frontal sinus tenderness Throat- clear post nasal drip  Eyes: Conjunctivae normal and EOM are normal. Pupils are equal, round, and reactive to light. Right eye exhibits no discharge. Left eye exhibits no discharge.  Neck: Normal range of motion. Neck supple. No JVD present. No thyromegaly present.  Pulmonary/Chest: Effort normal and breath sounds normal. No respiratory distress. She has no  wheezes. She has no rales.  Lymphadenopathy:    She has no cervical adenopathy.  Neurological: She is alert. She has normal reflexes. No cranial nerve deficit.  Skin: Skin is warm and dry. No rash noted. No erythema. No pallor.  Psychiatric: She has a normal mood and affect.          Assessment & Plan:

## 2012-03-03 NOTE — Telephone Encounter (Signed)
I appreciate the help---I have a meeting today Will await Dr Royden Purl eval

## 2012-03-16 ENCOUNTER — Telehealth: Payer: Self-pay | Admitting: Internal Medicine

## 2012-03-16 NOTE — Telephone Encounter (Signed)
Patient Information:  Caller Name: Madeline Reid  Phone: [phone]  Patient: Madeline Reid, Madeline Reid  Gender: Female  DOB: [birth year]  Age: 42 Years  PCP: Tillman Abide [facility])  Pregnant: No  Office Follow Up:  Does the office need to follow up with this patient?: Yes  Instructions For The Office: Caller does not want to come in again for the same sxs as 03/03/12 and would like treatment only. PLEASE ADVISE.  RN Note:  Fullness in ears. Nasal drainage is clear with some green. Caller is able to eat, drink, void, and interact within normal limits. Caller states sxs are the same as last sinus infection on 03/03/12.  Symptoms  Reason For Call & Symptoms: Runny nose, sinus pressure, headache, top of teeth hurt. Given antibiotics on 2/5 for Sinus infection. caller finished antibiotics and sxs have returned  Reviewed Health History In EMR: Yes  Reviewed Medications In EMR: Yes  Reviewed Allergies In EMR: Yes  Reviewed Surgeries / Procedures: Yes  Date of Onset of Symptoms: 03/13/2012  Treatments Tried: day time cold and flu, allergy medication, nasal spray  Treatments Tried Worked: No OB / GYN:  LMP: 03/09/2012  Guideline(s) Used:  Sinus Pain and Congestion  Disposition Per Guideline:   Home Care  Reason For Disposition Reached:   Sinus congestion as part of a cold, present < 10 days  Advice Given:  Reassurance:   Sinus congestion is a normal part of a cold.  Usually home treatment with nasal washes can prevent an actual bacterial sinus infection.  Antibiotics are not helpful for the sinus congestion that occurs with colds.  Here is some care advice that should help.  For a Runny Nose With Profuse Discharge:  Nasal mucus and discharge helps to wash viruses and bacteria out of the nose and sinuses.  Blowing the nose is all that is needed.  If the skin around your nostrils gets irritated, apply a tiny amount of petroleum ointment to the nasal openings once or twice a day.  Medicines for a Stuffy or Runny Nose:  Most cold medicines that are available over-the-counter (OTC) are not helpful.  Antihistamines: Are only helpful if you also have nasal allergies.  If you have a very runny nose and you really think you need a medicine, you can try using a nasal decongestant for a couple days.  Hydration:  Drink plenty of liquids (6-8 glasses of water daily). If the air in your home is dry, use a cool mist humidifier  Expected Course:  Sinus congestion from viral upper respiratory infections (colds) usually lasts 5-10 days.  Occasionally a cold can worsen and turn into bacterial sinusitis. Clues to this are sinus symptoms lasting longer than 10 days, fever lasting longer than 3 days, and worsening pain. Bacterial sinusitis may need antibiotic treatment.  Call Back If:   Severe pain lasts longer than 2 hours after pain medicine  Sinus pain lasts longer than 1 day after starting treatment using nasal washes  Sinus congestion (fullness) lasts longer than 10 days  Fever lasts longer than 3 days  You become worse.  Patient Refused Recommendation:  Patient Requests Prescription  Caller does not want to come in again for the same sxs.

## 2012-03-17 MED ORDER — LEVOFLOXACIN 500 MG PO TABS: 500.0000 mg | ORAL_TABLET | Freq: Every day | ORAL | Status: DC

## 2012-03-17 NOTE — Telephone Encounter (Signed)
Spoke with patient and advised results rx sent to pharmacy by e-script  

## 2012-03-17 NOTE — Telephone Encounter (Signed)
Okay to try Rx with levofloxacin 500mg  daily since failed augmentin #10 x 0

## 2012-04-16 ENCOUNTER — Encounter: Payer: Self-pay | Admitting: Internal Medicine

## 2012-04-16 ENCOUNTER — Ambulatory Visit (INDEPENDENT_AMBULATORY_CARE_PROVIDER_SITE_OTHER): Payer: 59 | Admitting: Internal Medicine

## 2012-04-16 VITALS — BP 120/80 | HR 94 | Temp 97.5°F | Ht 65.5 in | Wt 141.0 lb

## 2012-04-16 DIAGNOSIS — Z0000 Encounter for general adult medical examination without abnormal findings: Secondary | ICD-10-CM

## 2012-04-16 DIAGNOSIS — I10 Essential (primary) hypertension: Secondary | ICD-10-CM

## 2012-04-16 DIAGNOSIS — R079 Chest pain, unspecified: Secondary | ICD-10-CM

## 2012-04-16 NOTE — Assessment & Plan Note (Signed)
Very atypical Aggressive with exercise without problems EKG normal No action needed

## 2012-04-16 NOTE — Assessment & Plan Note (Signed)
Healthy Madeline Reid schedule with the gyn

## 2012-04-16 NOTE — Patient Instructions (Signed)
You can try off your blood pressure pill if you want. Please restart it if your blood pressure is consistently over 140/90 or you have problems with the swelling again.

## 2012-04-16 NOTE — Progress Notes (Signed)
Subjective:    Patient ID: Madeline Reid, female    DOB: 1970/12/26, 42 y.o.   MRN: [id number]  HPI Here for physical No more sinus problems  Overdue for gynecologist Will be going back soon Thinking about mammogram  Sporadically has some pain over upper left chest Usually just sitting---not exertional No SOB Not related to eating that she is aware of Still with regular exercise---no problems Still on low carb diet  Checks BP at home Usually 120-130/70-80  Current Outpatient Prescriptions on File Prior to Visit  Medication Sig Dispense Refill  . triamterene-hydrochlorothiazide (MAXZIDE-25) 37.5-25 MG per tablet TAKE 1 TABLET BY MOUTH ONCE A DAY  90 tablet  2  . [DISCONTINUED] omeprazole (PRILOSEC) 20 MG capsule Take 20 mg by mouth daily.         No current facility-administered medications on file prior to visit.    Allergies  Allergen Reactions  . Lisinopril     Potential cough    Past Medical History  Diagnosis Date  . Hypertension   . GERD (gastroesophageal reflux disease)   . Endometriosis     Past Surgical History  Procedure Laterality Date  . Laparoscopy  1992    endometriosis  . Myomectomy  2007    removed    Family History  Problem Relation Age of Onset  . Coronary artery disease Maternal Grandmother   . Coronary artery disease Paternal Grandmother   . Cancer Paternal Grandfather     throat cancer  . Colon cancer Neg Hx   . Breast cancer Neg Hx     History   Social History  . Marital Status: Married    Spouse Name: N/A    Number of Children: N/A  . Years of Education: N/A   Occupational History  . 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 Smoking status: Former Smoker    Types: Cigarettes    Quit date: 08/28/2006  . Smokeless tobacco: Never Used  . Alcohol Use: Yes     Comment: occ  . Drug Use: No  . Sexually Active: Not on file   Other Topics Concern  . Not on file   Social History Narrative   Is a   foster parent   Will be formally adopting 2 children they have been fostering         Review of Systems  Constitutional: Negative for fatigue and unexpected weight change.       Wears seat belt  HENT: Negative for hearing loss, congestion, rhinorrhea, dental problem and tinnitus.        Regular with dentist  Eyes: Negative for visual disturbance.       No diplopia or unilateral vision loss  Respiratory: Negative for cough, chest tightness and shortness of breath.   Cardiovascular: Positive for chest pain. Negative for palpitations and leg swelling.  Gastrointestinal: Negative for nausea, vomiting, abdominal pain, constipation and blood in stool.       No heartburn  Endocrine: Negative for cold intolerance and heat intolerance.  Genitourinary: Negative for urgency, frequency, difficulty urinating and dyspareunia.  Musculoskeletal: Positive for arthralgias. Negative for back pain and joint swelling.       Has brief left shoulder pain--was short lived  Skin: Negative for rash.       No suspicious lesions  Allergic/Immunologic: Negative for environmental allergies and immunocompromised state.  Neurological: Negative for dizziness, syncope, weakness, light-headedness, numbness and headaches.  Hematological: Negative for adenopathy. Does not bruise/bleed easily.  Objective:   Physical Exam  Constitutional: She is oriented to person, place, and time. She appears well-developed and well-nourished. No distress.  HENT:  Head: Normocephalic and atraumatic.  Right Ear: External ear normal.  Left Ear: External ear normal.  Mouth/Throat: Oropharynx is clear and moist. No oropharyngeal exudate.  Eyes: Conjunctivae and EOM are normal. Pupils are equal, round, and reactive to light.  Neck: Normal range of motion. Neck supple. No thyromegaly present.  Cardiovascular: Normal rate, regular rhythm, normal heart sounds and intact distal pulses.  Exam reveals no gallop.   No murmur  heard. Pulmonary/Chest: Effort normal and breath sounds normal. No respiratory distress. She has no wheezes. She has no rales.  Abdominal: Soft. There is no tenderness.  Musculoskeletal: She exhibits no edema and no tenderness.  Lymphadenopathy:    She has no cervical adenopathy.  Neurological: She is alert and oriented to person, place, and time.  Skin: No rash noted. No erythema.  Psychiatric: She has a normal mood and affect. Her behavior is normal.          Assessment & Plan:

## 2012-04-16 NOTE — Assessment & Plan Note (Signed)
BP Readings from Last 3 Encounters:  04/16/12 120/80  03/03/12 142/96  04/15/11 118/80   BP fine except when she was sick She will try cutting down or stopping---did get swelling last time she tried

## 2012-04-17 LAB — CBC WITH DIFFERENTIAL/PLATELET
Basophils Absolute: 0 10*3/uL (ref 0.0–0.2)
HCT: 43.1 % (ref 34.0–46.6)
Immature Granulocytes: 0 % (ref 0–2)
Lymphocytes Absolute: 1.9 10*3/uL (ref 0.7–3.1)
MCHC: 35.3 g/dL (ref 31.5–35.7)
MCV: 92 fL (ref 79–97)
Monocytes Absolute: 0.7 10*3/uL (ref 0.1–0.9)
RDW: 12.9 % (ref 12.3–15.4)

## 2012-04-17 LAB — LIPID PANEL: HDL: 75 mg/dL (ref >39)

## 2012-04-17 LAB — BASIC METABOLIC PANEL
Calcium: 9.9 mg/dL (ref 8.7–10.2)
GFR calc Af Amer: 95 mL/min/{1.73_m2} (ref >59)
GFR calc non Af Amer: 82 mL/min/{1.73_m2} (ref >59)
Potassium: 3.6 mmol/L (ref 3.5–5.2)
Sodium: 137 mmol/L (ref 134–144)

## 2012-04-17 LAB — HEPATIC FUNCTION PANEL
AST: 30 IU/L (ref 0–40)
Alkaline Phosphatase: 61 IU/L (ref 39–117)

## 2012-04-19 ENCOUNTER — Encounter: Payer: Self-pay | Admitting: *Deleted

## 2012-04-20 ENCOUNTER — Telehealth: Payer: Self-pay

## 2012-04-20 NOTE — Telephone Encounter (Signed)
Pt lost information for my chart; pt request activation code for last visit and Patient notified as instructed by telephone the recent result note.Marland Kitchen

## 2012-06-28 LAB — HM PAP SMEAR: HM Pap smear: NORMAL

## 2012-07-20 ENCOUNTER — Encounter: Payer: Self-pay | Admitting: Internal Medicine

## 2012-07-23 ENCOUNTER — Other Ambulatory Visit: Payer: Self-pay | Admitting: Family Medicine

## 2012-07-23 MED ORDER — TRIAMTERENE-HCTZ 37.5-25 MG PO TABS: 1.0000 | ORAL_TABLET | Freq: Every day | ORAL | Status: DC

## 2012-07-23 NOTE — Telephone Encounter (Signed)
rx sent to pharmacy by e-script  

## 2012-07-23 NOTE — Telephone Encounter (Signed)
Pt forgot medication at home and is on vacation.  Requesting RX be sent to [facility].

## 2012-07-23 NOTE — Telephone Encounter (Signed)
Okay to send #30 x 0 

## 2012-09-17 ENCOUNTER — Ambulatory Visit (INDEPENDENT_AMBULATORY_CARE_PROVIDER_SITE_OTHER): Payer: 59 | Admitting: Internal Medicine

## 2012-09-17 ENCOUNTER — Encounter: Payer: Self-pay | Admitting: Internal Medicine

## 2012-09-17 VITALS — BP 140/90 | HR 80 | Temp 98.1°F | Wt 143.0 lb

## 2012-09-17 DIAGNOSIS — S139XXA Sprain of joints and ligaments of unspecified parts of neck, initial encounter: Secondary | ICD-10-CM

## 2012-09-17 DIAGNOSIS — S134XXA Sprain of ligaments of cervical spine, initial encounter: Secondary | ICD-10-CM

## 2012-09-17 MED ORDER — CYCLOBENZAPRINE HCL 10 MG PO TABS: 5.0000 mg | ORAL_TABLET | Freq: Every evening | ORAL | Status: DC | PRN

## 2012-09-17 NOTE — Patient Instructions (Signed)
Try heat on your neck Use ibuprofen 600mg  (3 tabs) three times a day after eating till your pain is better. If these don't help you sleep, try the cyclobenzaprine
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
## 2012-09-17 NOTE — Assessment & Plan Note (Signed)
Mild Mostly pain with retained ROM Discussed heat Ibuprofen 600 tid till pain better Cyclobenzaprine 5-10 at bedtime prn

## 2012-12-02 ENCOUNTER — Other Ambulatory Visit: Payer: Self-pay

## 2013-04-22 ENCOUNTER — Encounter: Payer: Self-pay | Admitting: Internal Medicine

## 2013-04-22 ENCOUNTER — Ambulatory Visit (INDEPENDENT_AMBULATORY_CARE_PROVIDER_SITE_OTHER): Payer: 59 | Admitting: Internal Medicine

## 2013-04-22 VITALS — BP 128/88 | HR 70 | Temp 98.1°F | Ht 65.0 in | Wt 138.0 lb

## 2013-04-22 DIAGNOSIS — I10 Essential (primary) hypertension: Secondary | ICD-10-CM

## 2013-04-22 DIAGNOSIS — Z0000 Encounter for general adult medical examination without abnormal findings: Secondary | ICD-10-CM

## 2013-04-22 DIAGNOSIS — Z23 Encounter for immunization: Secondary | ICD-10-CM

## 2013-04-22 MED ORDER — LOSARTAN POTASSIUM 25 MG PO TABS: 25.0000 mg | ORAL_TABLET | Freq: Every day | ORAL | Status: DC

## 2013-04-22 NOTE — Assessment & Plan Note (Signed)
With proteinuria and negative nephrology work up Will continue the ARB

## 2013-04-22 NOTE — Progress Notes (Signed)
Pre visit review using our clinic review tool, if applicable. No additional management support is needed unless otherwise documented below in the visit note. 

## 2013-04-22 NOTE — Addendum Note (Signed)
Addended by: Despina Hidden on: 04/22/2013 01:03 PM   Modules accepted: Orders

## 2013-04-22 NOTE — Assessment & Plan Note (Signed)
Healthy Due for Tdap

## 2013-04-22 NOTE — Progress Notes (Signed)
Subjective:    Patient ID: Madeline Reid, female    DOB: 1970/02/12, 43 y.o.   MRN: [id number]  HPI Here for physical  Had proteinuria noted at gyn visit--recurred Seen by Dr Juleen China No etiology found Started on losartan by him and continues on this No problems with this  No regular exercise Plays golf and will walk the course  Current Outpatient Prescriptions on File Prior to Visit  Medication Sig Dispense Refill  . [DISCONTINUED] omeprazole (PRILOSEC) 20 MG capsule Take 20 mg by mouth daily.         No current facility-administered medications on file prior to visit.    Allergies  Allergen Reactions  . Lisinopril     Potential cough    Past Medical History  Diagnosis Date  . Hypertension   . GERD (gastroesophageal reflux disease)   . Endometriosis     Past Surgical History  Procedure Laterality Date  . Laparoscopy  1992    endometriosis  . Myomectomy  2007    removed    Family History  Problem Relation Age of Onset  . Coronary artery disease Maternal Grandmother   . Coronary artery disease Paternal Grandmother   . Cancer Paternal Grandfather     throat cancer  . Colon cancer Neg Hx   . Breast cancer Neg Hx      Review of Systems  Constitutional: Negative for fatigue and unexpected weight change.       Wears seat belt  HENT: Negative for dental problem, hearing loss and tinnitus.        Regular with dentist  Eyes: Negative for visual disturbance.       No unilateral vision loss  Respiratory: Negative for cough, chest tightness and shortness of breath.   Cardiovascular: Positive for palpitations. Negative for chest pain and leg swelling.       Rare limited palpitations  Gastrointestinal: Negative for nausea, vomiting, abdominal pain, constipation and blood in stool.       Just getting over food poisoning No heartburn since on low carb  Endocrine: Positive for cold intolerance. Negative for heat intolerance.       Fingertips turn white and  hurt in cold  Genitourinary: Negative for dysuria, hematuria, difficulty urinating and dyspareunia.       Periods regular  Musculoskeletal: Positive for arthralgias. Negative for back pain and joint swelling.       Right knee clicks---some pain now  Skin: Negative for rash.       Plans to have some places checked by dermatologist  Allergic/Immunologic: Negative for environmental allergies and immunocompromised state.  Neurological: Negative for dizziness, syncope, weakness, light-headedness, numbness and headaches.  Hematological: Negative for adenopathy. Does not bruise/bleed easily.  Psychiatric/Behavioral: Negative for sleep disturbance and dysphoric mood. The patient is not nervous/anxious.        Objective:   Physical Exam  Constitutional: She is oriented to person, place, and time. She appears well-developed and well-nourished. No distress.  HENT:  Head: Normocephalic and atraumatic.  Right Ear: External ear normal.  Left Ear: External ear normal.  Mouth/Throat: Oropharynx is clear and moist. No oropharyngeal exudate.  Eyes: Conjunctivae and EOM are normal. Pupils are equal, round, and reactive to light.  Neck: Normal range of motion. No thyromegaly present.  Cardiovascular: Normal rate, regular rhythm, normal heart sounds and intact distal pulses.  Exam reveals no gallop.   No murmur heard. Pulmonary/Chest: Effort normal and breath sounds normal. No respiratory distress. She has no wheezes.  She has no rales.  Abdominal: Soft. There is no tenderness.  Musculoskeletal: She exhibits no edema and no tenderness.  Lymphadenopathy:    She has no cervical adenopathy.  Neurological: She is alert and oriented to person, place, and time.  Skin: No rash noted. No erythema.  seb keratosis below right breast Multiple small nevi  Psychiatric: She has a normal mood and affect. Her behavior is normal.          Assessment & Plan:

## 2013-04-23 ENCOUNTER — Telehealth: Payer: Self-pay | Admitting: Internal Medicine

## 2013-04-23 LAB — LIPID PANEL
CHOL/HDL RATIO: 3.1 ratio (ref 0.0–4.4)
Cholesterol, Total: 199 mg/dL (ref 100–199)
HDL: 65 mg/dL (ref >39)
LDL CALC: 92 mg/dL (ref 0–99)
Triglycerides: 210 mg/dL — ABNORMAL HIGH (ref 0–149)
VLDL Cholesterol Cal: 42 mg/dL — ABNORMAL HIGH (ref 5–40)

## 2013-04-23 LAB — COMPREHENSIVE METABOLIC PANEL
A/G RATIO: 1.8 (ref 1.1–2.5)
ALBUMIN: 4.9 g/dL (ref 3.5–5.5)
ALT: 44 IU/L — AB (ref 0–32)
AST: 31 IU/L (ref 0–40)
Alkaline Phosphatase: 63 IU/L (ref 39–117)
BILIRUBIN TOTAL: 0.3 mg/dL (ref 0.0–1.2)
BUN/Creatinine Ratio: 14 (ref 9–23)
BUN: 10 mg/dL (ref 6–24)
CO2: 19 mmol/L (ref 18–29)
Calcium: 9.2 mg/dL (ref 8.7–10.2)
Chloride: 91 mmol/L — ABNORMAL LOW (ref 97–108)
Creatinine, Ser: 0.69 mg/dL (ref 0.57–1.00)
GFR calc Af Amer: 122 mL/min/{1.73_m2} (ref >59)
GFR, EST NON AFRICAN AMERICAN: 105 mL/min/{1.73_m2} (ref >59)
GLUCOSE: 85 mg/dL (ref 65–99)
Globulin, Total: 2.7 g/dL (ref 1.5–4.5)
POTASSIUM: 3.8 mmol/L (ref 3.5–5.2)
Sodium: 131 mmol/L — ABNORMAL LOW (ref 134–144)
TOTAL PROTEIN: 7.6 g/dL (ref 6.0–8.5)

## 2013-04-23 LAB — CBC WITH DIFFERENTIAL/PLATELET
Basophils Absolute: 0 10*3/uL (ref 0.0–0.2)
Basos: 1 %
Eos: 4 %
Eosinophils Absolute: 0.2 10*3/uL (ref 0.0–0.4)
HEMATOCRIT: 42.4 % (ref 34.0–46.6)
Hemoglobin: 14.3 g/dL (ref 11.1–15.9)
IMMATURE GRANULOCYTES: 0 %
Immature Grans (Abs): 0 10*3/uL (ref 0.0–0.1)
Lymphocytes Absolute: 0.9 10*3/uL (ref 0.7–3.1)
Lymphs: 21 %
MCH: 31.4 pg (ref 26.6–33.0)
MCHC: 33.7 g/dL (ref 31.5–35.7)
MCV: 93 fL (ref 79–97)
MONOS ABS: 0.5 10*3/uL (ref 0.1–0.9)
Monocytes: 12 %
Neutrophils Absolute: 2.8 10*3/uL (ref 1.4–7.0)
Neutrophils Relative %: 62 %
RBC: 4.56 x10E6/uL (ref 3.77–5.28)
RDW: 13.7 % (ref 12.3–15.4)
WBC: 4.5 10*3/uL (ref 3.4–10.8)

## 2013-04-23 LAB — MICROALBUMIN / CREATININE URINE RATIO
Creatinine, Ur: 12 mg/dL — ABNORMAL LOW (ref 15.0–278.0)
MICROALB/CREAT RATIO: 178.3 mg/g{creat} — AB (ref 0.0–30.0)
MICROALBUM., U, RANDOM: 21.4 ug/mL — AB (ref 0.0–17.0)

## 2013-04-23 LAB — TSH: TSH: 2.62 u[IU]/mL (ref 0.450–4.500)

## 2013-04-23 LAB — T4, FREE: Free T4: 1.06 ng/dL (ref 0.82–1.77)

## 2013-04-23 NOTE — Telephone Encounter (Signed)
Relevant patient education assigned to patient using Emmi. ° °

## 2013-05-17 ENCOUNTER — Other Ambulatory Visit: Payer: Self-pay | Admitting: Internal Medicine

## 2013-05-17 DIAGNOSIS — E871 Hypo-osmolality and hyponatremia: Secondary | ICD-10-CM

## 2013-05-24 ENCOUNTER — Other Ambulatory Visit: Payer: 59

## 2013-08-05 ENCOUNTER — Encounter: Payer: Self-pay | Admitting: Internal Medicine

## 2013-08-05 ENCOUNTER — Ambulatory Visit (INDEPENDENT_AMBULATORY_CARE_PROVIDER_SITE_OTHER): Payer: 59 | Admitting: Internal Medicine

## 2013-08-05 VITALS — BP 138/80 | HR 55 | Temp 98.2°F | Wt 139.0 lb

## 2013-08-05 DIAGNOSIS — E871 Hypo-osmolality and hyponatremia: Secondary | ICD-10-CM

## 2013-08-05 DIAGNOSIS — L570 Actinic keratosis: Secondary | ICD-10-CM | POA: Insufficient documentation

## 2013-08-05 NOTE — Assessment & Plan Note (Signed)
Treated with liquid nitrogen 45 seconds x 2 Discussed home care If recurs, will be going to derm in August anyway--would then need it excised

## 2013-08-05 NOTE — Addendum Note (Signed)
Addended by: Royann Shivers A on: 08/05/2013 01:57 PM   Modules accepted: Orders

## 2013-08-05 NOTE — Progress Notes (Signed)
Pre visit review using our clinic review tool, if applicable. No additional management support is needed unless otherwise documented below in the visit note. 

## 2013-08-05 NOTE — Progress Notes (Signed)
   Subjective:    Patient ID: Madeline Reid, female    DOB: 1970/06/14, 43 y.o.   MRN: [id number]  HPI Irritated lesion on left calf Started as just a crusted spot-- no bite or injury Then quickly changed---thicker, inflamed No pain  Current Outpatient Prescriptions on File Prior to Visit  Medication Sig Dispense Refill  . losartan (COZAAR) 25 MG tablet Take 1 tablet (25 mg total) by mouth daily.  90 tablet  3  . [DISCONTINUED] omeprazole (PRILOSEC) 20 MG capsule Take 20 mg by mouth daily.         No current facility-administered medications on file prior to visit.    Allergies  Allergen Reactions  . Lisinopril     Potential cough    Past Medical History  Diagnosis Date  . Hypertension   . GERD (gastroesophageal reflux disease)   . Endometriosis     Past Surgical History  Procedure Laterality Date  . Laparoscopy  1992    endometriosis  . Myomectomy  2007    removed    Family History  Problem Relation Age of Onset  . Coronary artery disease Maternal Grandmother   . Coronary artery disease Paternal Grandmother   . Cancer Paternal Grandfather     throat cancer  . Colon cancer Neg Hx   . Breast cancer Neg Hx     History   Social History  . Marital Status: Married    Spouse Name: N/A    Number of Children: 2  . Years of Education: N/A   Occupational History  . 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 Smoking status: Former Smoker    Types: Cigarettes    Quit date: 08/28/2006  . Smokeless tobacco: Never Used  . Alcohol Use: Yes     Comment: occ  . Drug Use: No  . Sexual Activity: Not on file   Other Topics Concern  . Not on file   Social History Narrative   Adopted 2 children in 2012            Review of Systems     Objective:   Physical Exam        Assessment & Plan:

## 2013-08-06 LAB — BASIC METABOLIC PANEL
BUN/Creatinine Ratio: 15 (ref 9–23)
BUN: 10 mg/dL (ref 6–24)
CO2: 23 mmol/L (ref 18–29)
Calcium: 9.7 mg/dL (ref 8.7–10.2)
Chloride: 99 mmol/L (ref 97–108)
Creatinine, Ser: 0.66 mg/dL (ref 0.57–1.00)
GFR calc Af Amer: 123 mL/min/{1.73_m2} (ref >59)
GFR, EST NON AFRICAN AMERICAN: 107 mL/min/{1.73_m2} (ref >59)
Glucose: 84 mg/dL (ref 65–99)
POTASSIUM: 4.2 mmol/L (ref 3.5–5.2)
SODIUM: 138 mmol/L (ref 134–144)

## 2013-11-11 ENCOUNTER — Other Ambulatory Visit: Payer: Self-pay

## 2013-12-27 ENCOUNTER — Ambulatory Visit (INDEPENDENT_AMBULATORY_CARE_PROVIDER_SITE_OTHER): Payer: 59 | Admitting: Internal Medicine

## 2013-12-27 ENCOUNTER — Encounter: Payer: Self-pay | Admitting: Internal Medicine

## 2013-12-27 VITALS — BP 140/80 | HR 78 | Temp 98.1°F | Wt 139.0 lb

## 2013-12-27 DIAGNOSIS — R300 Dysuria: Secondary | ICD-10-CM

## 2013-12-27 DIAGNOSIS — N3090 Cystitis, unspecified without hematuria: Secondary | ICD-10-CM

## 2013-12-27 LAB — POCT URINALYSIS DIPSTICK
BILIRUBIN UA: NEGATIVE
Glucose, UA: NEGATIVE
Ketones, UA: NEGATIVE
Leukocytes, UA: NEGATIVE
Nitrite, UA: POSITIVE
Protein, UA: NEGATIVE
RBC UA: NEGATIVE
Spec Grav, UA: 1.02
Urobilinogen, UA: NEGATIVE
pH, UA: 6

## 2013-12-27 MED ORDER — SULFAMETHOXAZOLE-TRIMETHOPRIM 800-160 MG PO TABS: 1.0000 | ORAL_TABLET | Freq: Two times a day (BID) | ORAL | Status: DC

## 2013-12-27 NOTE — Progress Notes (Signed)
Pre visit review using our clinic review tool, if applicable. No additional management support is needed unless otherwise documented below in the visit note. 

## 2013-12-27 NOTE — Patient Instructions (Signed)
If your urinary symptoms don't improve over the next few days, go ahead and try the antibiotic for 3 days. If you actually start feeling sick, take the antibiotic the full 10 days.

## 2013-12-27 NOTE — Assessment & Plan Note (Signed)
Mild symptoms that are better now but not gone Discussed continuing increased fluids Septra if worsens

## 2013-12-27 NOTE — Progress Notes (Signed)
   Subjective:    Patient ID: Madeline Reid, female    DOB: [birth year], 43 y.o.   MRN: [id number]  HPI having some mild urinary symptoms Awoke 2 days ago--had abnormal odor Some urgency--even after just voiding Mild burning dysuria  Better today no hematuria No nausea or vomiting  Drank lots of fluids and tried ibuprofen  Current Outpatient Prescriptions on File Prior to Visit  Medication Sig Dispense Refill  . losartan (COZAAR) 25 MG tablet Take 1 tablet (25 mg total) by mouth daily. 90 tablet 3  . [DISCONTINUED] omeprazole (PRILOSEC) 20 MG capsule Take 20 mg by mouth daily.       No current facility-administered medications on file prior to visit.    Allergies  Allergen Reactions  . Lisinopril     Potential cough    Past Medical History  Diagnosis Date  . Hypertension   . GERD (gastroesophageal reflux disease)   . Endometriosis     Past Surgical History  Procedure Laterality Date  . Laparoscopy  1992    endometriosis  . Myomectomy  2007    removed    Family History  Problem Relation Age of Onset  . Coronary artery disease Maternal Grandmother   . Coronary artery disease Paternal Grandmother   . Cancer Paternal Grandfather     throat cancer  . Colon cancer Neg Hx   . Breast cancer Neg Hx     History   Social History  . Marital Status: Married    Spouse Name: N/A    Number of Children: 2  . Years of Education: N/A   Occupational History  . 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 Smoking status: Former Smoker    Types: Cigarettes    Quit date: 08/28/2006  . Smokeless tobacco: Never Used  . Alcohol Use: Yes     Comment: occ  . Drug Use: No  . Sexual Activity: Not on file   Other Topics Concern  . Not on file   Social History Narrative   Adopted 2 children in 2012            Review of Systems  No dyspareunia No back pain No abdominal pain No vaginal discharge     Objective:   Physical Exam  Constitutional:  She appears well-developed and well-nourished. No distress.  Abdominal: She exhibits no distension. There is no tenderness. There is no rebound and no guarding.  Slight sensitivity in suprapubic area  Musculoskeletal:  No CVA tenderness          Assessment & Plan:

## 2014-01-16 ENCOUNTER — Telehealth: Payer: Self-pay

## 2014-01-16 NOTE — Telephone Encounter (Signed)
Pt left v/m; pt was seen 12/27/13; pt has finished Septra and still having symptoms as when seen; pt wants to know if needs different med or should pt come back in for f/u appt. Tried to reach pt to confirm symptoms but no answer on phone; at 12/27/13 visit had some urgency and burning upon urination.Please advise.

## 2014-01-16 NOTE — Telephone Encounter (Signed)
.  left message to have patient return my call.  

## 2014-01-16 NOTE — Telephone Encounter (Signed)
If she has taken the full course of med and is not better--not certain this is infection  Will need culture if it is though--so should have repeat visit to try to figure out what is going on

## 2014-01-17 NOTE — Telephone Encounter (Signed)
Spoke with patient and advised results appt scheduled 

## 2014-01-18 ENCOUNTER — Encounter: Payer: Self-pay | Admitting: Internal Medicine

## 2014-01-18 ENCOUNTER — Ambulatory Visit (INDEPENDENT_AMBULATORY_CARE_PROVIDER_SITE_OTHER): Payer: 59 | Admitting: Internal Medicine

## 2014-01-18 VITALS — BP 128/80 | HR 98 | Temp 97.5°F | Wt 137.0 lb

## 2014-01-18 DIAGNOSIS — R300 Dysuria: Secondary | ICD-10-CM

## 2014-01-18 DIAGNOSIS — N3090 Cystitis, unspecified without hematuria: Secondary | ICD-10-CM

## 2014-01-18 LAB — POCT URINALYSIS DIPSTICK
BILIRUBIN UA: NEGATIVE
Blood, UA: NEGATIVE
GLUCOSE UA: NEGATIVE
Ketones, UA: NEGATIVE
Leukocytes, UA: NEGATIVE
Nitrite, UA: NEGATIVE
SPEC GRAV UA: 1.02
Urobilinogen, UA: NEGATIVE
pH, UA: 6

## 2014-01-18 MED ORDER — FLUCONAZOLE 100 MG PO TABS: 100.0000 mg | ORAL_TABLET | Freq: Every day | ORAL | Status: DC

## 2014-01-18 NOTE — Progress Notes (Signed)
   Subjective:    Patient ID: Madeline Reid, female    DOB: 1970/09/20, 43 y.o.   MRN: [id number]  HPI Here due to ongoing urinary symptoms  Took the entire antibiotic course Not having the bladder symptoms---something "was just not right" Still has cloudiness to the urine despite the meds  Some mild burning sensation around vulva No rash No discharge--but may have had slight white discharge yesterday when wiping No hard or abnormal areas on vulva  No recent intercourse No noted dyspareunia in the past  Current Outpatient Prescriptions on File Prior to Visit  Medication Sig Dispense Refill  . losartan (COZAAR) 25 MG tablet Take 1 tablet (25 mg total) by mouth daily. 90 tablet 3  . [DISCONTINUED] omeprazole (PRILOSEC) 20 MG capsule Take 20 mg by mouth daily.       No current facility-administered medications on file prior to visit.    Allergies  Allergen Reactions  . Lisinopril     Potential cough    Past Medical History  Diagnosis Date  . Hypertension   . GERD (gastroesophageal reflux disease)   . Endometriosis     Past Surgical History  Procedure Laterality Date  . Laparoscopy  1992    endometriosis  . Myomectomy  2007    removed    Family History  Problem Relation Age of Onset  . Coronary artery disease Maternal Grandmother   . Coronary artery disease Paternal Grandmother   . Cancer Paternal Grandfather     throat cancer  . Colon cancer Neg Hx   . Breast cancer Neg Hx     History   Social History  . Marital Status: Married    Spouse Name: N/A    Number of Children: 2  . Years of Education: N/A   Occupational History  . 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 Smoking status: Former Smoker    Types: Cigarettes    Quit date: 08/28/2006  . Smokeless tobacco: Never Used  . Alcohol Use: Yes     Comment: occ  . Drug Use: No  . Sexual Activity: Not on file   Other Topics Concern  . Not on file   Social History Narrative     Adopted 2 children in 2012            Review of Systems No fever Not sick Usually sees gyn    Objective:   Physical Exam  Abdominal: Soft. She exhibits no distension. There is no tenderness. There is no rebound and no guarding.          Assessment & Plan:

## 2014-01-18 NOTE — Patient Instructions (Signed)
Please try the fluconazole and a daily probiotic. If your symptoms aren't better by next week, you should see the gynecologist.

## 2014-01-18 NOTE — Assessment & Plan Note (Signed)
These symptoms seem better but still mild burning --though not just with voiding ??yeast infection Was going to go to gyn but they are closed till next week  Discussed options (exam here or wait) Will try empiric anti fungal and probiotic Will see gyn next week if not better

## 2014-01-18 NOTE — Progress Notes (Signed)
Pre visit review using our clinic review tool, if applicable. No additional management support is needed unless otherwise documented below in the visit note. 

## 2014-02-16 ENCOUNTER — Encounter: Payer: Self-pay | Admitting: Internal Medicine

## 2014-02-16 LAB — HM MAMMOGRAPHY: HM Mammogram: NORMAL

## 2014-02-20 ENCOUNTER — Encounter: Payer: Self-pay | Admitting: Internal Medicine

## 2014-02-20 ENCOUNTER — Other Ambulatory Visit: Payer: Self-pay | Admitting: Internal Medicine

## 2014-04-24 ENCOUNTER — Ambulatory Visit (INDEPENDENT_AMBULATORY_CARE_PROVIDER_SITE_OTHER): Payer: 59 | Admitting: Internal Medicine

## 2014-04-24 ENCOUNTER — Encounter: Payer: Self-pay | Admitting: Internal Medicine

## 2014-04-24 VITALS — BP 132/80 | HR 65 | Temp 98.0°F | Ht 65.0 in | Wt 140.0 lb

## 2014-04-24 DIAGNOSIS — Z0000 Encounter for general adult medical examination without abnormal findings: Secondary | ICD-10-CM

## 2014-04-24 DIAGNOSIS — I10 Essential (primary) hypertension: Secondary | ICD-10-CM | POA: Diagnosis not present

## 2014-04-24 NOTE — Progress Notes (Signed)
Pre visit review using our clinic review tool, if applicable. No additional management support is needed unless otherwise documented below in the visit note. 

## 2014-04-24 NOTE — Progress Notes (Signed)
Subjective:    Patient ID: Madeline Reid, female    DOB: 1970/04/05, 44 y.o.   MRN: [id number]  HPI Here for physical  No further UTIs Does follow [facility] Did have abnormal PAP and had biopsy. This was abnormal as well and is going to have repeat Pap in 4 months No sure about HPV testing--thinks it was negative Did get breast exam  Monitors her BP--has been okay Exercising more--feels better No problems with the losartan Has fitbit--- notes her pulse up during the day (may have some anxiety related elevated BP and pulse--can actually be over 100 not exercising) No symptoms  Current Outpatient Prescriptions on File Prior to Visit  Medication Sig Dispense Refill  . losartan (COZAAR) 25 MG tablet Take 1 tablet by mouth  daily 90 tablet 0  . [DISCONTINUED] omeprazole (PRILOSEC) 20 MG capsule Take 20 mg by mouth daily.       No current facility-administered medications on file prior to visit.    Allergies  Allergen Reactions  . Lisinopril     Potential cough    Past Medical History  Diagnosis Date  . Hypertension   . GERD (gastroesophageal reflux disease)   . Endometriosis     Past Surgical History  Procedure Laterality Date  . Laparoscopy  1992    endometriosis  . Myomectomy  2007    removed    Family History  Problem Relation Age of Onset  . Coronary artery disease Maternal Grandmother   . Coronary artery disease Paternal Grandmother   . Cancer Paternal Grandfather     throat cancer  . Colon cancer Neg Hx   . Breast cancer Neg Hx     History   Social History  . Marital Status: Married    Spouse Name: N/A  . Number of Children: 2  . Years of Education: N/A   Occupational History  . 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 Smoking status: Former Smoker    Types: Cigarettes    Quit date: 08/28/2006  . Smokeless tobacco: Never Used  . Alcohol Use: Yes     Comment: occ  . Drug Use: No  . Sexual Activity: Not  on file   Other Topics Concern  . Not on file   Social History Narrative   Adopted 2 children in 2012               Review of Systems  Constitutional: Negative for fatigue and unexpected weight change.       Wears seat belt  HENT: Negative for dental problem, hearing loss and tinnitus.        Regular with dentist  Eyes: Negative for visual disturbance.       No diplopia or unilateral vision loss  Respiratory: Negative for cough, chest tightness and shortness of breath.   Cardiovascular: Positive for palpitations. Negative for chest pain and leg swelling.  Gastrointestinal: Negative for nausea, vomiting, abdominal pain, constipation and blood in stool.  Genitourinary: Negative for dysuria, hematuria, difficulty urinating and dyspareunia.  Musculoskeletal: Negative for back pain, joint swelling and arthralgias.  Skin: Negative for rash.       No suspicious lesions  Allergic/Immunologic: Positive for environmental allergies. Negative for immunocompromised state.       Using OTC eye drops and allegra  Neurological: Negative for dizziness, syncope, weakness, light-headedness and headaches.  Hematological: Negative for adenopathy. Does not bruise/bleed easily.  Psychiatric/Behavioral: Negative for sleep disturbance and dysphoric  mood. The patient is nervous/anxious.        Doesn't feel stressed or really anxious--but notes the fast heart at times       Objective:   Physical Exam  Constitutional: She is oriented to person, place, and time. She appears well-developed and well-nourished. No distress.  HENT:  Head: Normocephalic and atraumatic.  Right Ear: External ear normal.  Left Ear: External ear normal.  Mouth/Throat: Oropharynx is clear and moist. No oropharyngeal exudate.  Eyes: Conjunctivae and EOM are normal. Pupils are equal, round, and reactive to light.  Neck: Normal range of motion. Neck supple. No thyromegaly present.  Cardiovascular: Normal rate, regular rhythm,  normal heart sounds and intact distal pulses.  Exam reveals no gallop.   No murmur heard. Pulmonary/Chest: Effort normal and breath sounds normal. No respiratory distress. She has no wheezes. She has no rales.  Abdominal: Soft. There is no tenderness.  Musculoskeletal: She exhibits no edema or tenderness.  Lymphadenopathy:    She has no cervical adenopathy.  Neurological: She is alert and oriented to person, place, and time.  Skin: No rash noted. No erythema.  Psychiatric: She has a normal mood and affect. Her behavior is normal.          Assessment & Plan:

## 2014-04-24 NOTE — Assessment & Plan Note (Signed)
With slight proteinuria in past Doing fine with the losartan

## 2014-04-24 NOTE — Assessment & Plan Note (Signed)
Healthy UTD on mammogram Recent abnormal Pap and biopsy--but not severe Working on fitness

## 2014-04-25 LAB — CBC WITH DIFFERENTIAL/PLATELET
BASOS: 1 %
Basophils Absolute: 0.1 10*3/uL (ref 0.0–0.2)
Eos: 2 %
Eosinophils Absolute: 0.2 10*3/uL (ref 0.0–0.4)
HEMATOCRIT: 43.5 % (ref 34.0–46.6)
Hemoglobin: 14.5 g/dL (ref 11.1–15.9)
IMMATURE GRANS (ABS): 0 10*3/uL (ref 0.0–0.1)
IMMATURE GRANULOCYTES: 0 %
LYMPHS: 31 %
Lymphocytes Absolute: 2.1 10*3/uL (ref 0.7–3.1)
MCH: 31.7 pg (ref 26.6–33.0)
MCHC: 33.3 g/dL (ref 31.5–35.7)
MCV: 95 fL (ref 79–97)
MONOCYTES: 8 %
MONOS ABS: 0.6 10*3/uL (ref 0.1–0.9)
NEUTROS ABS: 3.9 10*3/uL (ref 1.4–7.0)
NEUTROS PCT: 58 %
Platelets: 328 10*3/uL (ref 150–379)
RBC: 4.57 x10E6/uL (ref 3.77–5.28)
RDW: 13.8 % (ref 12.3–15.4)
WBC: 6.8 10*3/uL (ref 3.4–10.8)

## 2014-04-25 LAB — COMPREHENSIVE METABOLIC PANEL
A/G RATIO: 2 (ref 1.1–2.5)
ALT: 30 IU/L (ref 0–32)
AST: 19 IU/L (ref 0–40)
Albumin: 4.9 g/dL (ref 3.5–5.5)
Alkaline Phosphatase: 52 IU/L (ref 39–117)
BUN / CREAT RATIO: 14 (ref 9–23)
BUN: 10 mg/dL (ref 6–24)
Bilirubin Total: 0.4 mg/dL (ref 0.0–1.2)
CALCIUM: 9.5 mg/dL (ref 8.7–10.2)
CHLORIDE: 102 mmol/L (ref 97–108)
CO2: 21 mmol/L (ref 18–29)
CREATININE: 0.71 mg/dL (ref 0.57–1.00)
GFR, EST AFRICAN AMERICAN: 118 mL/min/{1.73_m2} (ref >59)
GFR, EST NON AFRICAN AMERICAN: 102 mL/min/{1.73_m2} (ref >59)
GLOBULIN, TOTAL: 2.5 g/dL (ref 1.5–4.5)
Glucose: 90 mg/dL (ref 65–99)
Potassium: 4.1 mmol/L (ref 3.5–5.2)
SODIUM: 142 mmol/L (ref 134–144)
Total Protein: 7.4 g/dL (ref 6.0–8.5)

## 2014-04-25 LAB — LIPID PANEL
Chol/HDL Ratio: 2.9 ratio units (ref 0.0–4.4)
Cholesterol, Total: 240 mg/dL — ABNORMAL HIGH (ref 100–199)
HDL: 83 mg/dL (ref >39)
LDL CALC: 123 mg/dL — AB (ref 0–99)
Triglycerides: 172 mg/dL — ABNORMAL HIGH (ref 0–149)
VLDL Cholesterol Cal: 34 mg/dL (ref 5–40)

## 2014-04-25 LAB — MICROALBUMIN / CREATININE URINE RATIO
CREATININE, UR: 55.7 mg/dL (ref 15.0–278.0)
MICROALB/CREAT RATIO: 259.4 mg/g creat — ABNORMAL HIGH (ref 0.0–30.0)
Microalbumin, Urine: 144.5 ug/mL — ABNORMAL HIGH (ref 0.0–17.0)

## 2014-04-25 LAB — T4, FREE: Free T4: 1.08 ng/dL (ref 0.82–1.77)

## 2014-10-23 ENCOUNTER — Other Ambulatory Visit: Payer: Self-pay | Admitting: Internal Medicine

## 2015-04-27 ENCOUNTER — Ambulatory Visit (INDEPENDENT_AMBULATORY_CARE_PROVIDER_SITE_OTHER): Payer: 59 | Admitting: Internal Medicine

## 2015-04-27 ENCOUNTER — Encounter: Payer: Self-pay | Admitting: Internal Medicine

## 2015-04-27 VITALS — BP 130/80 | HR 89 | Temp 98.7°F | Ht 65.0 in | Wt 142.0 lb

## 2015-04-27 DIAGNOSIS — Z0000 Encounter for general adult medical examination without abnormal findings: Secondary | ICD-10-CM

## 2015-04-27 DIAGNOSIS — I10 Essential (primary) hypertension: Secondary | ICD-10-CM | POA: Diagnosis not present

## 2015-04-27 DIAGNOSIS — M2550 Pain in unspecified joint: Secondary | ICD-10-CM | POA: Insufficient documentation

## 2015-04-27 NOTE — Progress Notes (Signed)
Pre visit review using our clinic review tool, if applicable. No additional management support is needed unless otherwise documented below in the visit note. 

## 2015-04-27 NOTE — Progress Notes (Signed)
Subjective:    Patient ID: Madeline Reid, female    DOB: [birth year], 45 y.o.   MRN: [id number]  HPI Here for physical  She is concerned that she may have lupus Has noticed hair loss-- but over 5 years Joints hurt--"everything sometimes" Will be stiff upon first standing up---takes a while to loosen up Doesn't really notice it in AM No clear joint swelling Concerned due to her known proteinuria Nails are thinner also (for her) Raynaud's has been worse also  Otherwise okay Stress at work--some anxiety. Nothing worrisome  Current Outpatient Prescriptions on File Prior to Visit  Medication Sig Dispense Refill  . losartan (COZAAR) 25 MG tablet Take 1 tablet by mouth  daily 90 tablet 3  . [DISCONTINUED] omeprazole (PRILOSEC) 20 MG capsule Take 20 mg by mouth daily.       No current facility-administered medications on file prior to visit.    Allergies  Allergen Reactions  . Lisinopril     Potential cough    Past Medical History  Diagnosis Date  . Hypertension   . GERD (gastroesophageal reflux disease)   . Endometriosis     Past Surgical History  Procedure Laterality Date  . Laparoscopy  1992    endometriosis  . Myomectomy  2007    removed    Family History  Problem Relation Age of Onset  . Coronary artery disease Maternal Grandmother   . Coronary artery disease Paternal Grandmother   . Cancer Paternal Grandfather     throat cancer  . Colon cancer Neg Hx   . Breast cancer Neg Hx     Social History   Social History  . Marital Status: Married    Spouse Name: N/A  . Number of Children: 2  . Years of Education: N/A   Occupational History  . 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 Smoking status: Former Smoker    Types: Cigarettes    Quit date: 08/28/2006  . Smokeless tobacco: Never Used  . Alcohol Use: Yes     Comment: occ  . Drug Use: No  . Sexual Activity: Not on file   Other Topics Concern  . Not on file   Social  History Narrative   Adopted 2 children in 2012            Review of Systems  Constitutional: Positive for fatigue. Negative for unexpected weight change.       Wears seat belt  HENT: Negative for dental problem, hearing loss and tinnitus.        Keeps up with dentist  Eyes: Negative for visual disturbance.       No diplopia or unilateral vision loss  Respiratory: Negative for cough, chest tightness and shortness of breath.   Cardiovascular: Negative for chest pain, palpitations and leg swelling.  Gastrointestinal: Negative for nausea, vomiting, abdominal pain, constipation and blood in stool.  Endocrine: Positive for polydipsia. Negative for polyuria.  Genitourinary: Negative for dysuria, hematuria, difficulty urinating and dyspareunia.       No pain with periods  Musculoskeletal: Positive for arthralgias. Negative for back pain and joint swelling.  Skin:       Does have malar redness--she uses heavy makeup to cover ?hives after [place] food  Allergic/Immunologic: Positive for environmental allergies. Negative for immunocompromised state.  Neurological: Negative for dizziness, syncope, weakness, light-headedness and headaches.  Hematological: Negative for adenopathy. Does not bruise/bleed easily.  Psychiatric/Behavioral: Negative for sleep disturbance and dysphoric mood. The patient  is nervous/anxious.        Objective:   Physical Exam  Constitutional: She is oriented to person, place, and time. She appears well-developed and well-nourished. No distress.  HENT:  Head: Normocephalic and atraumatic.  Right Ear: External ear normal.  Left Ear: External ear normal.  Mouth/Throat: Oropharynx is clear and moist. No oropharyngeal exudate.  Eyes: Conjunctivae are normal. Pupils are equal, round, and reactive to light.  Neck: Normal range of motion. Neck supple. No thyromegaly present.  Cardiovascular: Normal rate, regular rhythm, normal heart sounds and intact distal pulses.  Exam  reveals no gallop.   No murmur heard. Pulmonary/Chest: Effort normal and breath sounds normal. No respiratory distress. She has no wheezes. She has no rales.  Abdominal: Soft. There is no tenderness.  Musculoskeletal: She exhibits no edema or tenderness.  No active synovitis  Lymphadenopathy:    She has no cervical adenopathy.  Neurological: She is alert and oriented to person, place, and time.  Psychiatric: She has a normal mood and affect. Her behavior is normal.          Assessment & Plan:

## 2015-04-27 NOTE — Assessment & Plan Note (Signed)
Mammograms by gyn Yearly flu vaccine Discussed fitness

## 2015-04-27 NOTE — Assessment & Plan Note (Signed)
With diffuse alopecia, known proteinuria, Raynauds, ?malar rash Will do more serologic testing Consider rheum

## 2015-04-27 NOTE — Patient Instructions (Signed)
Even if all the tests are negative, you can consider seeing a rheumatologist for further evaluation.

## 2015-04-27 NOTE — Addendum Note (Signed)
Addended by: Daralene Milch C on: 04/27/2015 01:59 PM   Modules accepted: Orders, SmartSet

## 2015-04-27 NOTE — Assessment & Plan Note (Signed)
BP Readings from Last 3 Encounters:  04/27/15 130/80  04/24/14 132/80  01/18/14 128/80   Good control On mostly for proteinuria

## 2015-04-28 LAB — CBC WITH DIFFERENTIAL/PLATELET
BASOS ABS: 0 10*3/uL (ref 0.0–0.2)
Basos: 1 %
EOS (ABSOLUTE): 0.1 10*3/uL (ref 0.0–0.4)
Eos: 2 %
Hematocrit: 40.8 % (ref 34.0–46.6)
Hemoglobin: 13.6 g/dL (ref 11.1–15.9)
Immature Grans (Abs): 0.1 10*3/uL (ref 0.0–0.1)
Immature Granulocytes: 1 %
LYMPHS ABS: 1.6 10*3/uL (ref 0.7–3.1)
Lymphs: 24 %
MCH: 31.9 pg (ref 26.6–33.0)
MCHC: 33.3 g/dL (ref 31.5–35.7)
MCV: 96 fL (ref 79–97)
MONOS ABS: 0.6 10*3/uL (ref 0.1–0.9)
Monocytes: 9 %
Neutrophils Absolute: 4.2 10*3/uL (ref 1.4–7.0)
Neutrophils: 63 %
Platelets: 363 10*3/uL (ref 150–379)
RBC: 4.27 x10E6/uL (ref 3.77–5.28)
RDW: 13.8 % (ref 12.3–15.4)
WBC: 6.5 10*3/uL (ref 3.4–10.8)

## 2015-04-28 LAB — COMPREHENSIVE METABOLIC PANEL
A/G RATIO: 1.8 (ref 1.2–2.2)
ALK PHOS: 66 IU/L (ref 39–117)
ALT: 103 IU/L — AB (ref 0–32)
AST: 51 IU/L — AB (ref 0–40)
Albumin: 4.7 g/dL (ref 3.5–5.5)
BILIRUBIN TOTAL: 0.5 mg/dL (ref 0.0–1.2)
BUN/Creatinine Ratio: 20 (ref 9–23)
BUN: 12 mg/dL (ref 6–24)
CALCIUM: 9.4 mg/dL (ref 8.7–10.2)
CHLORIDE: 98 mmol/L (ref 96–106)
CO2: 19 mmol/L (ref 18–29)
Creatinine, Ser: 0.61 mg/dL (ref 0.57–1.00)
GFR calc Af Amer: 125 mL/min/{1.73_m2} (ref >59)
GFR, EST NON AFRICAN AMERICAN: 108 mL/min/{1.73_m2} (ref >59)
Globulin, Total: 2.6 g/dL (ref 1.5–4.5)
Glucose: 76 mg/dL (ref 65–99)
POTASSIUM: 4.1 mmol/L (ref 3.5–5.2)
Sodium: 138 mmol/L (ref 134–144)
Total Protein: 7.3 g/dL (ref 6.0–8.5)

## 2015-04-28 LAB — MICROALBUMIN / CREATININE URINE RATIO
CREATININE, UR: 16.8 mg/dL
MICROALB/CREAT RATIO: 225 mg/g creat — ABNORMAL HIGH (ref 0.0–30.0)
MICROALBUM., U, RANDOM: 37.8 ug/mL

## 2015-04-28 LAB — C3 AND C4
COMPLEMENT C4, SERUM: 18 mg/dL (ref 14–44)
Complement C3, Serum: 143 mg/dL (ref 82–167)

## 2015-04-28 LAB — ANA: Anti Nuclear Antibody(ANA): POSITIVE — AB

## 2015-04-28 LAB — SEDIMENTATION RATE: SED RATE: 7 mm/h (ref 0–32)

## 2015-04-28 LAB — C-REACTIVE PROTEIN: CRP: 3.5 mg/L (ref 0.0–4.9)

## 2015-04-28 LAB — ANTI-DNA ANTIBODY, DOUBLE-STRANDED: DSDNA AB: 23 [IU]/mL — AB (ref 0–9)

## 2015-04-28 LAB — T4, FREE: Free T4: 1.23 ng/dL (ref 0.82–1.77)

## 2015-04-28 LAB — ANTI-SMITH ANTIBODY: ENA SM Ab Ser-aCnc: <0.2 AI (ref 0.0–0.9)

## 2015-04-30 LAB — ANTIPHOSPHOLIPID SYNDROME PROF
Anticardiolipin IgM: <9 MPL U/mL (ref 0–12)
DRVVT: 27 s (ref 0.0–44.0)
PTT Lupus Anticoagulant: 36.6 s (ref 0.0–43.6)

## 2015-05-01 ENCOUNTER — Telehealth: Payer: Self-pay

## 2015-05-01 DIAGNOSIS — M2550 Pain in unspecified joint: Secondary | ICD-10-CM

## 2015-05-01 NOTE — Telephone Encounter (Signed)
-----   Message from Venia Carbon, MD sent at 05/01/2015  1:56 PM EDT ----- Please call her I released the bulk of her results yesterday and suggested a rheumatology referral. Find out if she is okay with this--and whether [facility] or [facility] [employer]

## 2015-05-01 NOTE — Telephone Encounter (Signed)
Patient said she would like to be referred to a Rheumatologist in [place]

## 2015-05-02 NOTE — Telephone Encounter (Signed)
Referral made 

## 2015-05-14 DIAGNOSIS — R768 Other specified abnormal immunological findings in serum: Secondary | ICD-10-CM | POA: Insufficient documentation

## 2015-05-14 DIAGNOSIS — I7300 Raynaud's syndrome without gangrene: Secondary | ICD-10-CM | POA: Insufficient documentation

## 2015-06-21 ENCOUNTER — Other Ambulatory Visit: Payer: Self-pay | Admitting: Student

## 2015-06-21 DIAGNOSIS — R945 Abnormal results of liver function studies: Secondary | ICD-10-CM

## 2015-06-28 ENCOUNTER — Ambulatory Visit
Admission: RE | Admit: 2015-06-28 | Discharge: 2015-06-28 | Disposition: A | Discharge status: Home / Self Care | Payer: 59 | Source: Ambulatory Visit | Attending: Student | Admitting: Student

## 2015-06-28 DIAGNOSIS — R932 Abnormal findings on diagnostic imaging of liver and biliary tract: Secondary | ICD-10-CM | POA: Insufficient documentation

## 2015-06-28 DIAGNOSIS — R945 Abnormal results of liver function studies: Secondary | ICD-10-CM | POA: Diagnosis not present

## 2015-06-28 DIAGNOSIS — R160 Hepatomegaly, not elsewhere classified: Secondary | ICD-10-CM | POA: Insufficient documentation

## 2015-07-02 ENCOUNTER — Other Ambulatory Visit: Payer: Self-pay | Admitting: Student

## 2015-07-02 DIAGNOSIS — R932 Abnormal findings on diagnostic imaging of liver and biliary tract: Secondary | ICD-10-CM

## 2015-07-02 DIAGNOSIS — K769 Liver disease, unspecified: Secondary | ICD-10-CM

## 2015-07-11 ENCOUNTER — Ambulatory Visit
Admission: RE | Admit: 2015-07-11 | Discharge: 2015-07-11 | Disposition: A | Discharge status: Home / Self Care | Payer: 59 | Source: Ambulatory Visit | Attending: Student | Admitting: Student

## 2015-07-11 DIAGNOSIS — R932 Abnormal findings on diagnostic imaging of liver and biliary tract: Secondary | ICD-10-CM

## 2015-07-11 DIAGNOSIS — K769 Liver disease, unspecified: Secondary | ICD-10-CM

## 2015-07-20 ENCOUNTER — Ambulatory Visit: Payer: 59

## 2015-07-23 ENCOUNTER — Ambulatory Visit
Admission: RE | Admit: 2015-07-23 | Discharge: 2015-07-23 | Disposition: A | Discharge status: Home / Self Care | Payer: 59 | Source: Ambulatory Visit | Attending: Student | Admitting: Student

## 2015-07-23 MED ORDER — GADOXETATE DISODIUM 0.25 MMOL/ML IV SOLN
6.0000 mL | Freq: Once | INTRAVENOUS | Status: AC | PRN
  Administered 2015-07-23: 6 mL via INTRAVENOUS

## 2015-09-11 ENCOUNTER — Other Ambulatory Visit: Payer: Self-pay | Admitting: Internal Medicine

## 2015-10-24 ENCOUNTER — Other Ambulatory Visit: Payer: Self-pay | Admitting: Student

## 2015-10-24 DIAGNOSIS — K760 Fatty (change of) liver, not elsewhere classified: Secondary | ICD-10-CM

## 2015-10-24 DIAGNOSIS — D134 Benign neoplasm of liver: Secondary | ICD-10-CM

## 2015-10-30 ENCOUNTER — Ambulatory Visit: Payer: 59

## 2015-11-02 ENCOUNTER — Ambulatory Visit
Admission: RE | Admit: 2015-11-02 | Discharge: 2015-11-02 | Disposition: A | Discharge status: Home / Self Care | Payer: 59 | Source: Ambulatory Visit | Attending: Student | Admitting: Student

## 2015-11-02 DIAGNOSIS — D134 Benign neoplasm of liver: Secondary | ICD-10-CM

## 2015-11-02 DIAGNOSIS — K760 Fatty (change of) liver, not elsewhere classified: Secondary | ICD-10-CM | POA: Diagnosis not present

## 2015-11-02 DIAGNOSIS — K7689 Other specified diseases of liver: Secondary | ICD-10-CM | POA: Insufficient documentation

## 2015-12-02 ENCOUNTER — Other Ambulatory Visit: Payer: Self-pay | Admitting: Internal Medicine

## 2016-03-04 ENCOUNTER — Ambulatory Visit (INDEPENDENT_AMBULATORY_CARE_PROVIDER_SITE_OTHER): Payer: 59 | Admitting: Internal Medicine

## 2016-03-04 ENCOUNTER — Encounter: Payer: Self-pay | Admitting: Internal Medicine

## 2016-03-04 VITALS — BP 128/80 | HR 83 | Temp 98.3°F | Wt 155.0 lb

## 2016-03-04 DIAGNOSIS — J329 Chronic sinusitis, unspecified: Secondary | ICD-10-CM

## 2016-03-04 DIAGNOSIS — B9789 Other viral agents as the cause of diseases classified elsewhere: Secondary | ICD-10-CM

## 2016-03-04 MED ORDER — PREDNISONE 10 MG PO TABS: ORAL_TABLET | ORAL | 0 refills | Status: DC

## 2016-03-04 NOTE — Patient Instructions (Signed)

## 2016-03-04 NOTE — Progress Notes (Signed)
HPI  Pt presents to the clinic today with c/o facial pain and pressure, ear fullness and nasal congestion. This started 2 weeks ago. She is blowing blood tinged clear mucous out of her nose. She denies ear pain or decreased hearing. She denies fever, chills or body aches. She has tried Advil Cold and Sinus, Sudafed, antihistamine and Afrin with minimal relief. She has no history of seasonal allergies or breathing problems. She has had sick contacts. Her flu shot is UTD.  Review of Systems     Past Medical History:  Diagnosis Date  . Endometriosis   . GERD (gastroesophageal reflux disease)   . Hypertension     Family History  Problem Relation Age of Onset  . Coronary artery disease Maternal Grandmother   . Coronary artery disease Paternal Grandmother   . Cancer Paternal Grandfather     throat cancer  . Colon cancer Neg Hx   . Breast cancer Neg Hx     Social History   Social History  . Marital status: Married    Spouse name: N/A  . Number of children: 2  . Years of education: N/A   Occupational History  . 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 Smoking status: Former Smoker    Types: Cigarettes    Quit date: 08/28/2006  . Smokeless tobacco: Never Used  . Alcohol use Yes     Comment: occ  . Drug use: No  . Sexual activity: Not on file   Other Topics Concern  . Not on file   Social History Narrative   Adopted 2 children in 2012             Allergies  Allergen Reactions  . Lisinopril     Potential cough     Constitutional: Denies headache, fatigue, fever or abrupt weight changes.  HEENT:  Positive facial pain, nasal congestion. Denies eye redness, ear pain, ringing in the ears, wax buildup, runny nose or sore throat. Respiratory: Denies cough, difficulty breathing or shortness of breath.  Cardiovascular: Denies chest pain, chest tightness, palpitations or swelling in the hands or feet.   No other specific complaints in a complete review of  systems (except as listed in HPI above).  Objective:   BP 128/80   Pulse 83   Temp 98.3 F (36.8 C) (Oral)   Wt 155 lb (70.3 kg)   SpO2 99%   BMI 25.79 kg/m   General: Appears her stated age,  in NAD. HEENT: Head: normal shape and size, no sinus tenderness noted; Eyes: sclera white, no icterus, conjunctiva pink; Ears: Tm's gray and intact, normal light reflex; Nose: mucosa pink and moist, septum midline; Throat/Mouth: + PND. Teeth present, mucosa pink and moist, no exudate noted, no lesions or ulcerations noted.  Neck:  No adenopathy noted.  Cardiovascular: Normal rate and rhythm. S1,S2 noted.  No murmur, rubs or gallops noted.  Pulmonary/Chest: Normal effort and positive vesicular breath sounds. No respiratory distress. No wheezes, rales or ronchi noted.       Assessment & Plan:   Viral Sinusitis:  Can use a Neti Pot which can be purchased from your local drug store. No indication for abx at this time eRx for Pred Taper x 6 days Continue antihistamine If worse by Friday, call back, will send in Augmentin x 10 days  RTC as needed or if symptoms persist. Webb Silversmith, NP

## 2016-03-06 ENCOUNTER — Telehealth: Payer: Self-pay

## 2016-03-06 MED ORDER — AMOXICILLIN-POT CLAVULANATE 875-125 MG PO TABS: 1.0000 | ORAL_TABLET | Freq: Two times a day (BID) | ORAL | 0 refills | Status: DC

## 2016-03-06 NOTE — Telephone Encounter (Signed)
[facility]

## 2016-03-06 NOTE — Telephone Encounter (Signed)
Pt left VM on triage line saying she is not feeling better. She feels like she is getting worse. Feels like it is starting to move down to her chest.  Pt saw Webb Silversmith, NP on 03/04/16. Pt said she was advised to call back if worse and she would be prescribed an abx.  [facility] [place]

## 2016-03-06 NOTE — Addendum Note (Signed)
Addended by: Jearld Fenton on: 03/06/2016 03:35 PM   Modules accepted: Orders

## 2016-03-07 NOTE — Telephone Encounter (Signed)
Pt is aware.  

## 2016-05-17 ENCOUNTER — Other Ambulatory Visit: Payer: Self-pay | Admitting: Internal Medicine

## 2016-05-19 NOTE — Telephone Encounter (Signed)
Last f/u OV 03/2015. No upcoming appts. pls advise

## 2016-05-19 NOTE — Telephone Encounter (Signed)
LM on pts vm and advised her to contact office and schedule OV

## 2016-05-19 NOTE — Telephone Encounter (Signed)
Approved: okay for 3 months Have her schedule an appt

## 2016-08-07 ENCOUNTER — Other Ambulatory Visit: Payer: Self-pay | Admitting: Internal Medicine

## 2016-09-04 ENCOUNTER — Ambulatory Visit (INDEPENDENT_AMBULATORY_CARE_PROVIDER_SITE_OTHER): Payer: 59 | Admitting: Family Medicine

## 2016-09-04 ENCOUNTER — Encounter: Payer: Self-pay | Admitting: Family Medicine

## 2016-09-04 VITALS — BP 124/84 | HR 82 | Temp 98.4°F | Wt 167.2 lb

## 2016-09-04 DIAGNOSIS — R300 Dysuria: Secondary | ICD-10-CM | POA: Diagnosis not present

## 2016-09-04 LAB — POC URINALSYSI DIPSTICK (AUTOMATED)
Bilirubin, UA: NEGATIVE
Blood, UA: NEGATIVE
Glucose, UA: NEGATIVE
Ketones, UA: NEGATIVE
Leukocytes, UA: NEGATIVE
Nitrite, UA: NEGATIVE
PH UA: 6 (ref 5.0–8.0)
PROTEIN UA: NEGATIVE
SPEC GRAV UA: 1.025 (ref 1.010–1.025)
UROBILINOGEN UA: 0.2 U/dL

## 2016-09-04 MED ORDER — CEPHALEXIN 250 MG PO CAPS: 250.0000 mg | ORAL_CAPSULE | Freq: Two times a day (BID) | ORAL | 0 refills | Status: DC

## 2016-09-04 NOTE — Progress Notes (Signed)
Dysuria: worse yesterday than today.  Urgency and frequency and incomplete voiding.   duration of symptoms: for about 4 weeks.  She had been drinking a lot of fluids, was getting some better, then sx would return.  Has been off/on for the last few weeks.   abdominal pain:no fevers:no back pain:no vomiting:no  Meds, vitals, and allergies reviewed.   Per HPI unless specifically indicated in ROS section   GEN: nad, alert and oriented HEENT: mucous membranes moist NECK: supple CV: rrr.  PULM: ctab, no inc wob ABD: soft, +bs, suprapubic area not tender EXT: no edema BACK: no CVA pain

## 2016-09-04 NOTE — Patient Instructions (Signed)
Drink plenty of water and start the antibiotics today.  We'll contact you with your lab report.  Take care.   

## 2016-09-05 DIAGNOSIS — R300 Dysuria: Secondary | ICD-10-CM | POA: Insufficient documentation

## 2016-09-05 NOTE — Assessment & Plan Note (Addendum)
Discussed with patient. Urine culture, start Keflex, drink plenty of fluids. Update [place] as needed. She agrees. Okay for outpatient follow-up.

## 2016-09-07 LAB — URINE CULTURE

## 2016-10-27 ENCOUNTER — Other Ambulatory Visit: Payer: Self-pay | Admitting: Internal Medicine

## 2016-10-28 NOTE — Telephone Encounter (Signed)
Okay to fill for a year

## 2016-10-28 NOTE — Telephone Encounter (Signed)
Received refill electronically Has upcoming appointment scheduled 01/13/17 See allergy/contraindication. Okay to refill?

## 2017-01-13 ENCOUNTER — Encounter: Payer: Self-pay | Admitting: Internal Medicine

## 2017-01-13 ENCOUNTER — Ambulatory Visit (INDEPENDENT_AMBULATORY_CARE_PROVIDER_SITE_OTHER): Payer: 59 | Admitting: Internal Medicine

## 2017-01-13 VITALS — BP 128/86 | HR 89 | Temp 98.4°F | Ht 65.25 in | Wt 170.0 lb

## 2017-01-13 DIAGNOSIS — Z23 Encounter for immunization: Secondary | ICD-10-CM

## 2017-01-13 DIAGNOSIS — K760 Fatty (change of) liver, not elsewhere classified: Secondary | ICD-10-CM

## 2017-01-13 DIAGNOSIS — Z0000 Encounter for general adult medical examination without abnormal findings: Secondary | ICD-10-CM

## 2017-01-13 DIAGNOSIS — I10 Essential (primary) hypertension: Secondary | ICD-10-CM

## 2017-01-13 NOTE — Progress Notes (Signed)
Subjective:    Patient ID: Madeline Reid, female    DOB: [birth year], 46 y.o.   MRN: [id number]  HPI Here for physical  Still sees gyn---[facility] (Dr Georga Bora)  Has hepatic adenoma Repeat showed it stable Has steatosis also Gained a lot of weight--went off low carb diet (in past year) Not really doing exercise  Current Outpatient Medications on File Prior to Visit  Medication Sig Dispense Refill  . losartan (COZAAR) 25 MG tablet TAKE 1 TABLET BY MOUTH  DAILY 90 tablet 3  . [DISCONTINUED] omeprazole (PRILOSEC) 20 MG capsule Take 20 mg by mouth daily.       No current facility-administered medications on file prior to visit.     Allergies  Allergen Reactions  . Lisinopril     Potential cough    Past Medical History:  Diagnosis Date  . Endometriosis   . GERD (gastroesophageal reflux disease)   . Hypertension     Past Surgical History:  Procedure Laterality Date  . LAPAROSCOPY  1992   endometriosis  . MYOMECTOMY  2007   removed    Family History  Problem Relation Age of Onset  . Cancer Paternal Grandfather        throat cancer  . Coronary artery disease Maternal Grandmother   . Coronary artery disease Paternal Grandmother   . Colon cancer Neg Hx   . Breast cancer Neg Hx     Social History   Socioeconomic History  . Marital status: Married    Spouse name: Not on file  . Number of children: 2  . Years of education: Not on file  . Highest education level: Not on file  Social Needs  . Financial resource strain: Not on file  . Food insecurity - worry: Not on file  . Food insecurity - inability: Not on file  . Transportation needs - medical: Not on file  . Transportation needs - non-medical: Not on file  Occupational History  . Occupation: Engineer, agricultural: [employer]  Tobacco Use  . Smoking status: Former Smoker    Types: Cigarettes    Last attempt to quit: 08/28/2006    Years since quitting: 10.3  . Smokeless tobacco:  Never Used  Substance and Sexual Activity  . Alcohol use: Yes    Comment: occ  . Drug use: No  . Sexual activity: Not on file  Other Topics Concern  . Not on file  Social History Narrative   Adopted 2 children in 2012            Review of Systems  Constitutional: Positive for unexpected weight change. Negative for fatigue.       Wears seat belt  HENT: Positive for tinnitus. Negative for dental problem and hearing loss.        Keeps up with dentist  Eyes: Negative for visual disturbance.       No diplopia or unilateral vision loss  Respiratory: Negative for cough, chest tightness and shortness of breath.   Cardiovascular: Negative for chest pain, palpitations and leg swelling.  Gastrointestinal: Negative for blood in stool and constipation.       No heartburn  Endocrine: Negative for polydipsia and polyuria.  Genitourinary: Negative for dyspareunia, dysuria and hematuria.       Periods still normal  Musculoskeletal: Positive for arthralgias. Negative for joint swelling.       Some joint stiffness still  Skin: Negative for rash.       Hair  is more normal  Allergic/Immunologic: Negative for environmental allergies and immunocompromised state.  Neurological: Negative for dizziness, syncope, light-headedness and headaches.  Hematological: Negative for adenopathy. Does not bruise/bleed easily.  Psychiatric/Behavioral: Negative for dysphoric mood and sleep disturbance. The patient is not nervous/anxious.        Objective:   Physical Exam  Constitutional: No distress.  HENT:  Head: Normocephalic and atraumatic.  Right Ear: External ear normal.  Left Ear: External ear normal.  Mouth/Throat: Oropharynx is clear and moist. No oropharyngeal exudate.  Eyes: Conjunctivae are normal. Pupils are equal, round, and reactive to light.  Neck: No thyromegaly present.  Cardiovascular: Normal rate, regular rhythm, normal heart sounds and intact distal pulses. Exam reveals no gallop.  No  murmur heard. Pulmonary/Chest: Effort normal and breath sounds normal. No respiratory distress. She has no wheezes. She has no rales.  Abdominal: Soft. She exhibits no distension. There is no tenderness. There is no rebound and no guarding.  Musculoskeletal: She exhibits no edema or tenderness.  Lymphadenopathy:    She has no cervical adenopathy.  Neurological: She is alert.  Skin: No rash noted. No erythema.  Psychiatric: She has a normal mood and affect. Her behavior is normal.          Assessment & Plan:

## 2017-01-13 NOTE — Assessment & Plan Note (Addendum)
Will monitor labs She is holding off on more imaging--okay for now (adenoma) Consider ultrasound next year

## 2017-01-13 NOTE — Assessment & Plan Note (Signed)
Needs to work on lifestyle Discussed [place] instead of higher fat diets She will think about mammogram Sees gyn

## 2017-01-13 NOTE — Assessment & Plan Note (Signed)
BP Readings from Last 3 Encounters:  01/13/17 128/86  09/04/16 124/84  03/04/16 128/80   Good control

## 2017-01-14 LAB — COMPREHENSIVE METABOLIC PANEL
A/G RATIO: 1.8 (ref 1.2–2.2)
ALT: 30 IU/L (ref 0–32)
AST: 20 IU/L (ref 0–40)
Albumin: 4.7 g/dL (ref 3.5–5.5)
Alkaline Phosphatase: 83 IU/L (ref 39–117)
BUN / CREAT RATIO: 14 (ref 9–23)
BUN: 11 mg/dL (ref 6–24)
CHLORIDE: 103 mmol/L (ref 96–106)
CO2: 23 mmol/L (ref 20–29)
Calcium: 9.8 mg/dL (ref 8.7–10.2)
Creatinine, Ser: 0.76 mg/dL (ref 0.57–1.00)
GFR calc non Af Amer: 93 mL/min/{1.73_m2} (ref >59)
GFR, EST AFRICAN AMERICAN: 107 mL/min/{1.73_m2} (ref >59)
Globulin, Total: 2.6 g/dL (ref 1.5–4.5)
Glucose: 90 mg/dL (ref 65–99)
POTASSIUM: 4.5 mmol/L (ref 3.5–5.2)
Sodium: 139 mmol/L (ref 134–144)
TOTAL PROTEIN: 7.3 g/dL (ref 6.0–8.5)

## 2017-01-14 LAB — CBC
HEMOGLOBIN: 14.2 g/dL (ref 11.1–15.9)
Hematocrit: 41.5 % (ref 34.0–46.6)
MCH: 31 pg (ref 26.6–33.0)
MCHC: 34.2 g/dL (ref 31.5–35.7)
MCV: 91 fL (ref 79–97)
Platelets: 354 10*3/uL (ref 150–379)
RBC: 4.58 x10E6/uL (ref 3.77–5.28)
RDW: 13.9 % (ref 12.3–15.4)
WBC: 7.4 10*3/uL (ref 3.4–10.8)

## 2017-09-19 ENCOUNTER — Other Ambulatory Visit: Payer: Self-pay | Admitting: Internal Medicine

## 2017-09-21 NOTE — Telephone Encounter (Signed)
Electronic refill request Last office visit 01/13/17 Last refill 10/28/16 #90/3 Upcoming appointment 01/22/18 See allergy/contraindication

## 2017-09-21 NOTE — Telephone Encounter (Signed)
Let her know Rx sent

## 2017-10-16 IMAGING — US US ABDOMEN LIMITED
1 series · 14 of 25 positions shown · non-contrast
Comparison: No prior.

CLINICAL DATA: Abnormal liver function test.

EXAM:
US ABDOMEN LIMITED - RIGHT UPPER QUADRANT

[Series 1: us abdomen limited · 0.20mm/px · 14 of 69 slices shown]
[im 1/69]
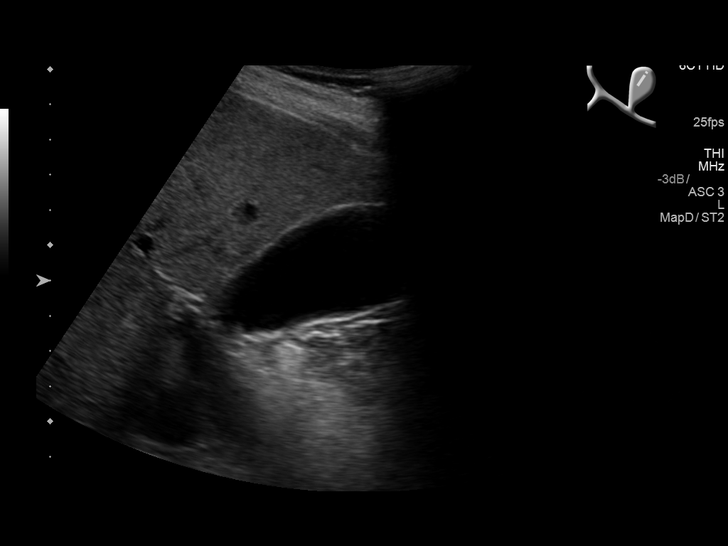
[im 6/69]
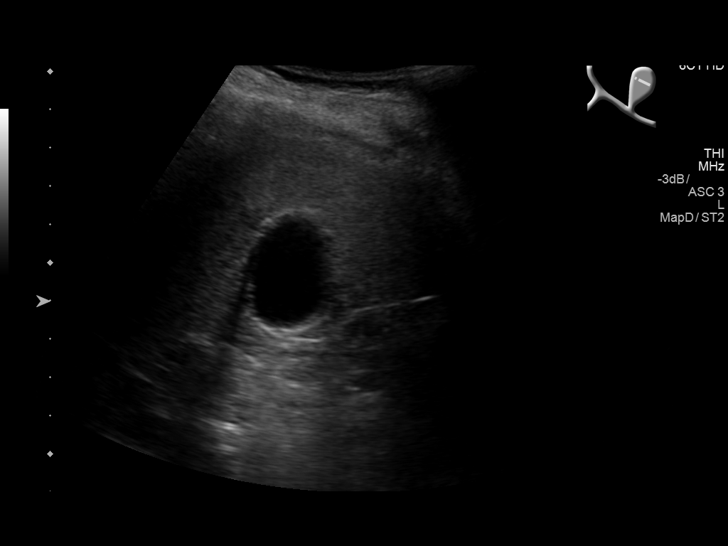
[im 12/69]
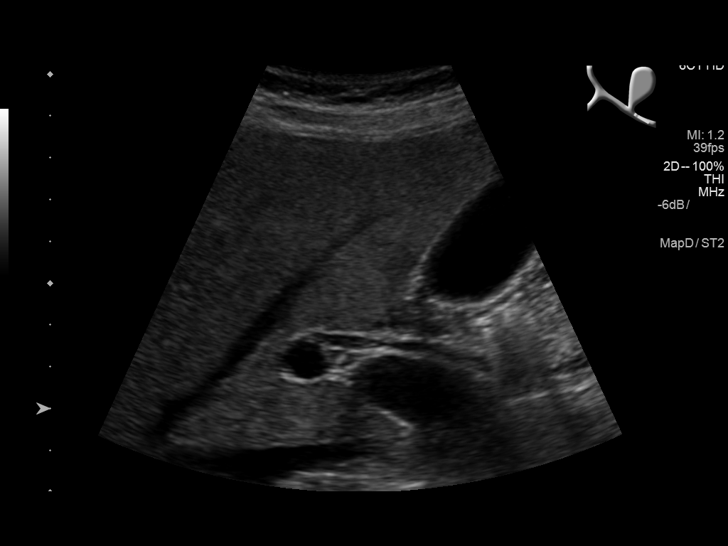
[im 18/69]
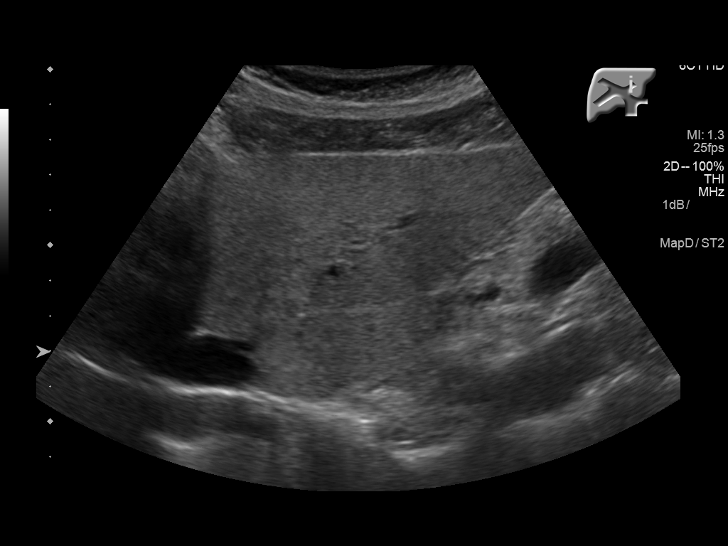
[im 23/69]
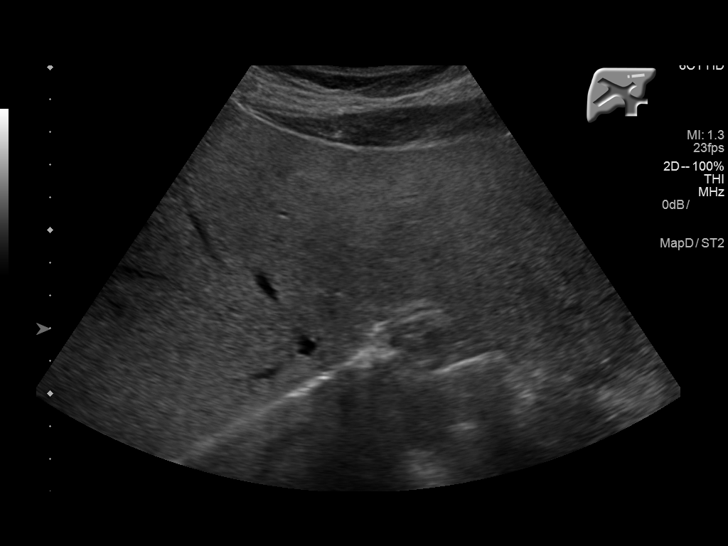
[im 26/69]
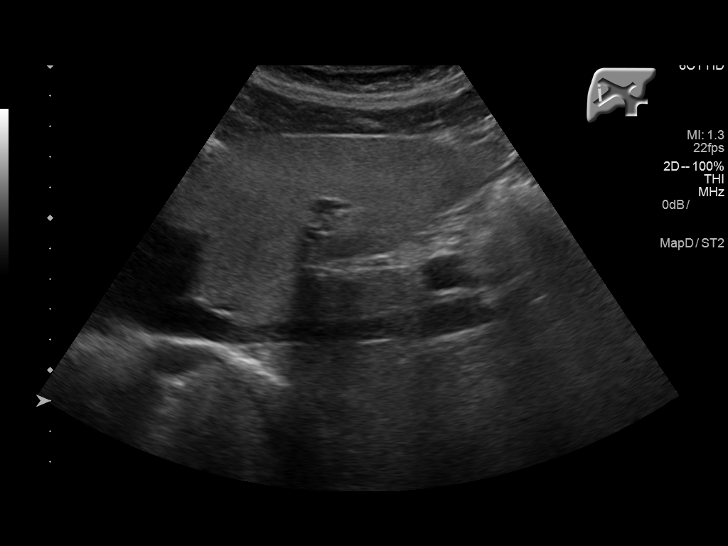
[im 32/69]
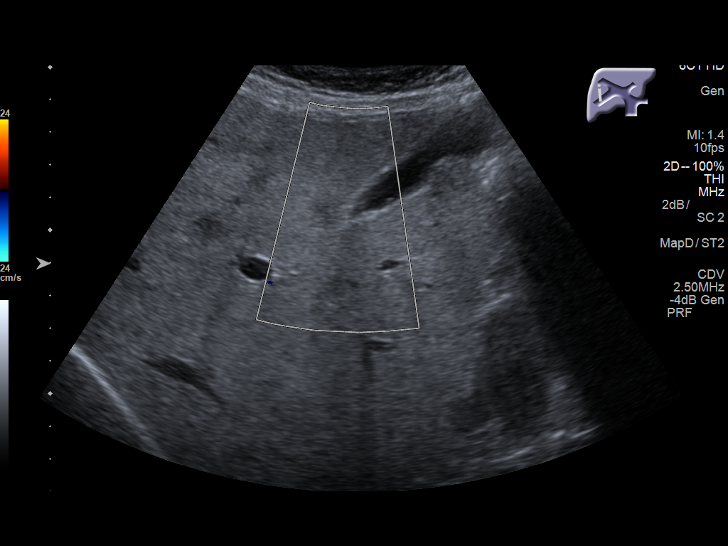
[im 37/69]
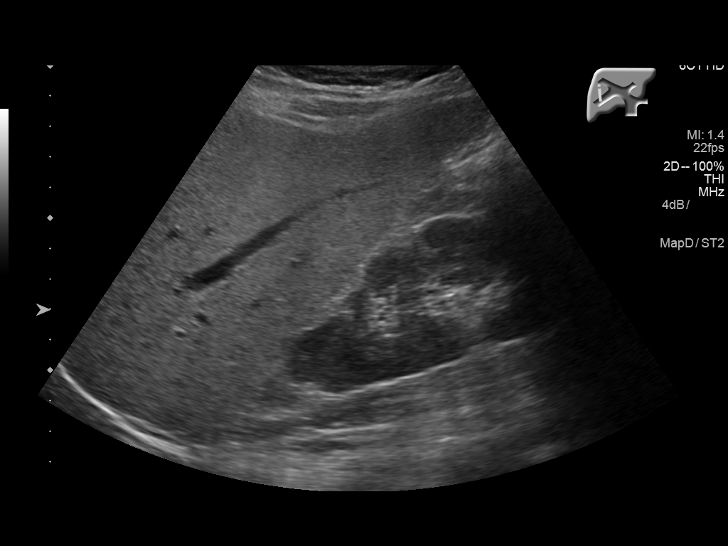
[im 43/69]
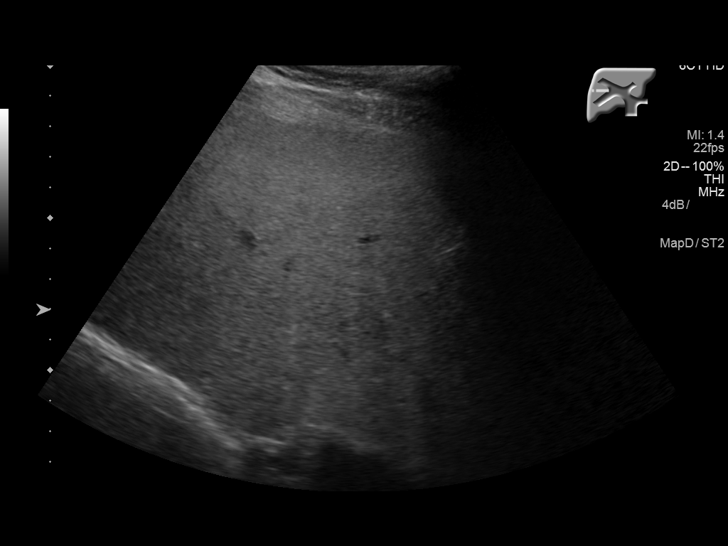
[im 46/69]
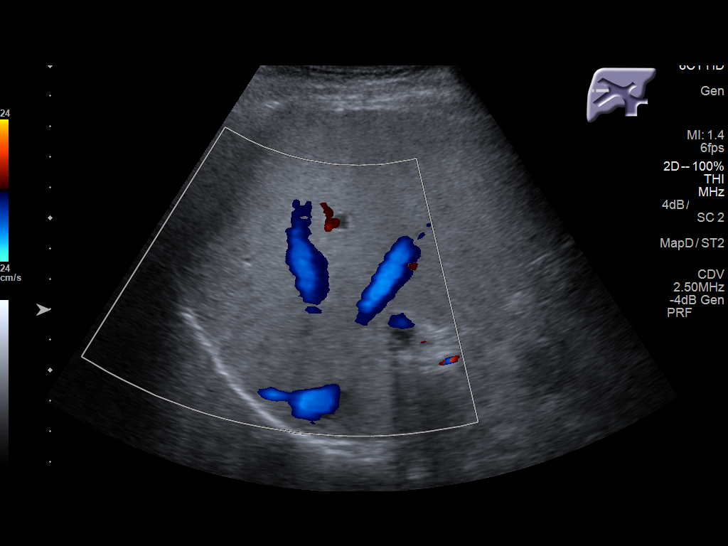
[im 52/69]
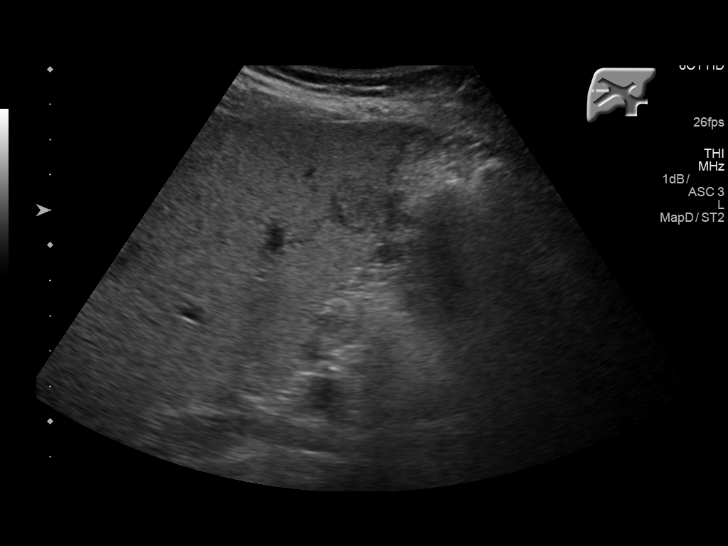
[im 57/69]
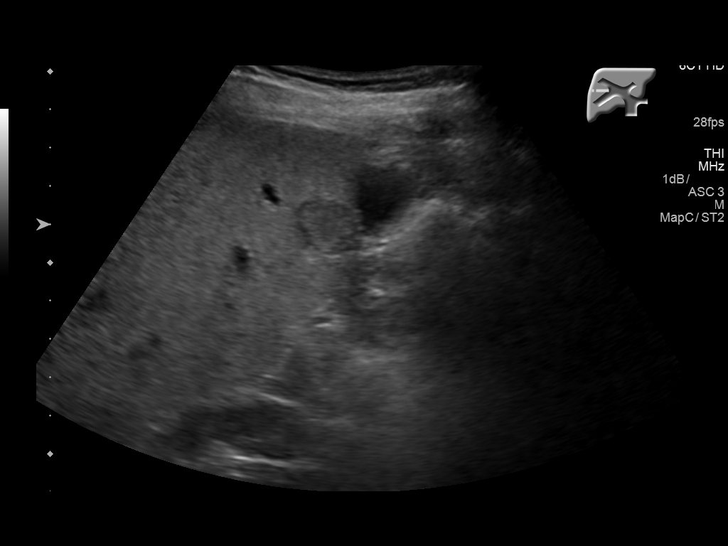
[im 63/69]
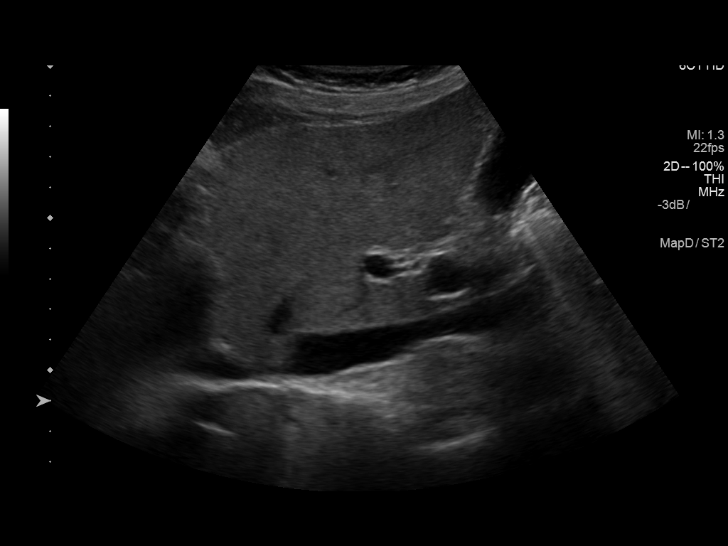
[im 69/69]
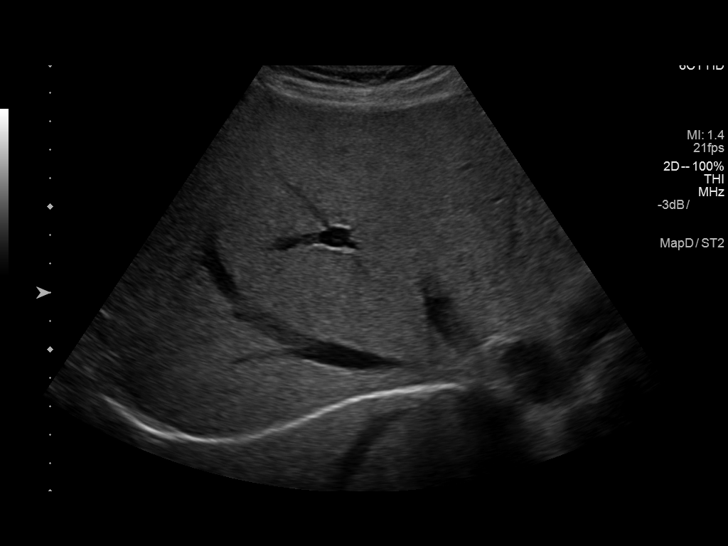

[14 of 25 positions shown; findings below may reference images not displayed]

FINDINGS: Gallbladder:

No gallstones or wall thickening visualized. No sonographic Murphy
sign noted by sonographer.

Common bile duct:

Diameter: 3.6 mm

Liver:

The liver is echogenic consistent fatty infiltration and/or or
hepatocellular disease. Rounded 1.8 x 1.7 x 1.7 cm mass with
hypoechoic rim is noted adjacent to the gallbladder fossa. Although
this could represent unusual appearance of focal fatty sparing of
focal significant lesion cannot be excluded and gadolinium-enhanced
MRI of the liver suggested for further evaluation.
IMPRESSION: 1. The liver is echogenic consistent with fatty infiltration and/or
hepatocellular disease. There is a rounded 1.8 x 1.7 x 1.7 cm
hepatic mass with hypoechoic rim adjacent to the gallbladder fossa.
Although this could represent unusual appearance of focal fatty
sparing, a focal significant hepatic lesion cannot be excluded and
gadolinium-enhanced MRI of the liver suggested for further
evaluation.

2.  No evidence of gallstones or biliary distention.

## 2018-01-22 ENCOUNTER — Ambulatory Visit (INDEPENDENT_AMBULATORY_CARE_PROVIDER_SITE_OTHER): Payer: Managed Care, Other (non HMO) | Admitting: Internal Medicine

## 2018-01-22 ENCOUNTER — Encounter: Payer: Self-pay | Admitting: Internal Medicine

## 2018-01-22 VITALS — BP 124/82 | HR 101 | Temp 98.3°F | Ht 65.0 in | Wt 155.0 lb

## 2018-01-22 DIAGNOSIS — K760 Fatty (change of) liver, not elsewhere classified: Secondary | ICD-10-CM | POA: Diagnosis not present

## 2018-01-22 DIAGNOSIS — Z23 Encounter for immunization: Secondary | ICD-10-CM | POA: Diagnosis not present

## 2018-01-22 DIAGNOSIS — I10 Essential (primary) hypertension: Secondary | ICD-10-CM | POA: Diagnosis not present

## 2018-01-22 DIAGNOSIS — Z1211 Encounter for screening for malignant neoplasm of colon: Secondary | ICD-10-CM

## 2018-01-22 DIAGNOSIS — Z0000 Encounter for general adult medical examination without abnormal findings: Secondary | ICD-10-CM

## 2018-01-22 DIAGNOSIS — F419 Anxiety disorder, unspecified: Secondary | ICD-10-CM | POA: Insufficient documentation

## 2018-01-22 MED ORDER — ALPRAZOLAM 0.25 MG PO TABS: 0.2500 mg | ORAL_TABLET | Freq: Two times a day (BID) | ORAL | 0 refills | Status: DC | PRN

## 2018-01-22 NOTE — Assessment & Plan Note (Signed)
May just be hormonal Check labs Xanax only if severe Consider further evaluation if worsens

## 2018-01-22 NOTE — Progress Notes (Signed)
Subjective:    Patient ID: Madeline Reid, female    DOB: 12-06-1970, 47 y.o.   MRN: [id number]  HPI Here for physical  Having a problem with her right shoulder Pain when sleeping on it Able to move it---but will feel a pop if she externally rotates--like to brush hair No injury Tries ROM exercises Some "crunching" Hasn't used any meds  Also having some anxiety issues Her heart rate will go up to 120's---without some obvious stress Has always been somewhat anxious---but worse now Same job--nothing different  Did have recent vacation Some palpitations and feels jittery Wonders if it could be related to menopause----LMP October  Some sweats, etc When she notes the tachycardia, etc--may take a few hours to calm herself Happens several times per week--getting ready for work or at work Doesn't affect her performance No depression  Has lost weight Working out more Is restricting startchy carbs Portion control  Current Outpatient Medications on File Prior to Visit  Medication Sig Dispense Refill  . losartan (COZAAR) 25 MG tablet TAKE 1 TABLET BY MOUTH  DAILY 90 tablet 3  . [DISCONTINUED] omeprazole (PRILOSEC) 20 MG capsule Take 20 mg by mouth daily.       No current facility-administered medications on file prior to visit.     Allergies  Allergen Reactions  . Lisinopril     Potential cough    Past Medical History:  Diagnosis Date  . Endometriosis   . GERD (gastroesophageal reflux disease)   . Hypertension     Past Surgical History:  Procedure Laterality Date  . LAPAROSCOPY  1992   endometriosis  . MYOMECTOMY  2007   removed    Family History  Problem Relation Age of Onset  . Cancer Paternal Grandfather        throat cancer  . Coronary artery disease Maternal Grandmother   . Coronary artery disease Paternal Grandmother   . Colon cancer Neg Hx   . Breast cancer Neg Hx     Social History   Socioeconomic History  . Marital status: Married   Spouse name: Not on file  . Number of children: 2  . Years of education: Not on file  . Highest education level: Not on file  Occupational History  . Occupation: Engineer, agricultural: [employer]  Social Needs  . Financial resource strain: Not on file  . Food insecurity:    Worry: Not on file    Inability: Not on file  . Transportation needs:    Medical: Not on file    Non-medical: Not on file  Tobacco Use  . Smoking status: Former Smoker    Types: Cigarettes    Last attempt to quit: 08/28/2006    Years since quitting: 11.4  . Smokeless tobacco: Never Used  Substance and Sexual Activity  . Alcohol use: Yes    Comment: occ  . Drug use: No  . Sexual activity: Not on file  Lifestyle  . Physical activity:    Days per week: Not on file    Minutes per session: Not on file  . Stress: Not on file  Relationships  . Social connections:    Talks on phone: Not on file    Gets together: Not on file    Attends religious service: Not on file    Active member of club or organization: Not on file    Attends meetings of clubs or organizations: Not on file    Relationship status: Not on  file  . Intimate partner violence:    Fear of current or ex partner: Not on file    Emotionally abused: Not on file    Physically abused: Not on file    Forced sexual activity: Not on file  Other Topics Concern  . Not on file  Social History Narrative   Adopted 2 children in 2012            Review of Systems  Constitutional: Negative for fatigue.       Wears seat belt  HENT: Negative for dental problem.        Fairly constant tinnitus Hearing is fair (unless competing sounds in loud room). No vertigo Keeps with with dentist  Eyes: Negative for visual disturbance.       No diplopia or unilateral vision loss  Respiratory: Negative for cough, chest tightness and shortness of breath.   Cardiovascular: Positive for palpitations. Negative for chest pain and leg swelling.  Gastrointestinal:  Negative for blood in stool and constipation.       No heartburn  Endocrine: Negative for polydipsia and polyuria.  Genitourinary: Negative for dyspareunia.       3 UTI's this year Has gone to urgent care May have been post coital and then recurred None for a while  Musculoskeletal: Positive for arthralgias. Negative for back pain and joint swelling.  Skin: Negative for rash.       No suspicious lesions  Allergic/Immunologic: Negative for environmental allergies and immunocompromised state.  Neurological: Negative for dizziness, syncope, light-headedness and headaches.  Hematological: Negative for adenopathy. Does not bruise/bleed easily.  Psychiatric/Behavioral: Negative for dysphoric mood and sleep disturbance. The patient is not nervous/anxious.        Objective:   Physical Exam  Constitutional: She appears well-developed. No distress.  HENT:  Head: Normocephalic and atraumatic.  Right Ear: External ear normal.  Left Ear: External ear normal.  Mouth/Throat: Oropharynx is clear and moist. No oropharyngeal exudate.  Eyes: Pupils are equal, round, and reactive to light. Conjunctivae are normal.  Neck: No thyromegaly present.  Cardiovascular: Normal rate, regular rhythm, normal heart sounds and intact distal pulses. Exam reveals no gallop.  No murmur heard. Respiratory: Effort normal and breath sounds normal. No respiratory distress. She has no wheezes. She has no rales.  GI: Soft. There is no abdominal tenderness.  Musculoskeletal:        General: No edema.     Comments: Normal ROM right shoulder Slight crepitus No tenderness  Lymphadenopathy:    She has no cervical adenopathy.  Skin: No rash noted. No erythema.  Psychiatric: She has a normal mood and affect. Her behavior is normal.           Assessment & Plan:

## 2018-01-22 NOTE — Assessment & Plan Note (Signed)
Healthy Has worked on fitness and lost some weight Recent mammogram Will do FIT Pap every 3-5 years per gyn Yearly flu vaccine--today

## 2018-01-22 NOTE — Assessment & Plan Note (Signed)
BP Readings from Last 3 Encounters:  01/22/18 124/82  01/13/17 128/86  09/04/16 124/84   Good control Consider change to beta blocker if palpitations persist

## 2018-01-22 NOTE — Assessment & Plan Note (Signed)
Has lost weight.  

## 2018-01-22 NOTE — Addendum Note (Signed)
Addended by: Pilar Grammes on: 01/22/2018 09:19 AM   Modules accepted: Orders

## 2018-01-23 LAB — SPECIMEN STATUS REPORT

## 2018-01-29 LAB — LIPID PANEL
CHOL/HDL RATIO: 2.5 ratio (ref 0.0–4.4)
Cholesterol, Total: 233 mg/dL — ABNORMAL HIGH (ref 100–199)
HDL: 94 mg/dL (ref >39)
LDL CALC: 100 mg/dL — AB (ref 0–99)
TRIGLYCERIDES: 194 mg/dL — AB (ref 0–149)
VLDL Cholesterol Cal: 39 mg/dL (ref 5–40)

## 2018-01-29 LAB — COMPREHENSIVE METABOLIC PANEL
ALT: 52 IU/L — AB (ref 0–32)
AST: 34 IU/L (ref 0–40)
Albumin/Globulin Ratio: 1.5 (ref 1.2–2.2)
Albumin: 4.7 g/dL (ref 3.5–5.5)
Alkaline Phosphatase: 96 IU/L (ref 39–117)
BUN/Creatinine Ratio: 18 (ref 9–23)
BUN: 16 mg/dL (ref 6–24)
CALCIUM: 9.8 mg/dL (ref 8.7–10.2)
CHLORIDE: 103 mmol/L (ref 96–106)
CO2: 18 mmol/L — AB (ref 20–29)
CREATININE: 0.89 mg/dL (ref 0.57–1.00)
GFR calc non Af Amer: 76 mL/min/{1.73_m2} (ref >59)
GFR, EST AFRICAN AMERICAN: 88 mL/min/{1.73_m2} (ref >59)
GLUCOSE: 98 mg/dL (ref 65–99)
Globulin, Total: 3.1 g/dL (ref 1.5–4.5)
Potassium: 4.2 mmol/L (ref 3.5–5.2)
Sodium: 142 mmol/L (ref 134–144)
TOTAL PROTEIN: 7.8 g/dL (ref 6.0–8.5)

## 2018-01-29 LAB — CBC
HEMATOCRIT: 42.3 % (ref 34.0–46.6)
HEMOGLOBIN: 14.7 g/dL (ref 11.1–15.9)
MCH: 31.3 pg (ref 26.6–33.0)
MCHC: 34.8 g/dL (ref 31.5–35.7)
MCV: 90 fL (ref 79–97)
Platelets: 309 10*3/uL (ref 150–450)
RBC: 4.7 x10E6/uL (ref 3.77–5.28)
RDW: 12.8 % (ref 12.3–15.4)
WBC: 5.3 10*3/uL (ref 3.4–10.8)

## 2018-01-29 LAB — T4, FREE: Free T4: 1.03 ng/dL (ref 0.82–1.77)

## 2018-02-20 IMAGING — US US ABDOMEN LIMITED
1 series · 14 of 25 positions shown · non-contrast
Comparison: MR 07/23/2015.  Ultrasound 06/28/2015.

CLINICAL DATA: Fatty liver, adenoma of the liver.

EXAM:
US ABDOMEN LIMITED - RIGHT UPPER QUADRANT

[Series 1: us abdomen limited · 0.17mm/px · 14 of 61 slices shown]
[im 1/61]
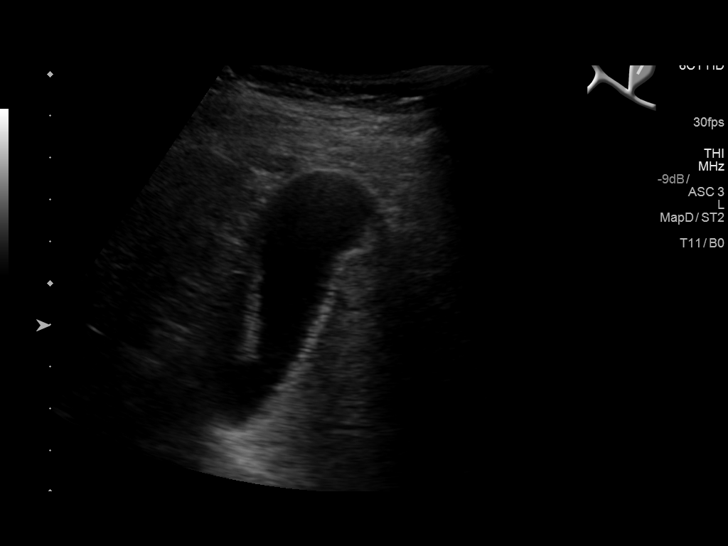
[im 6/61]
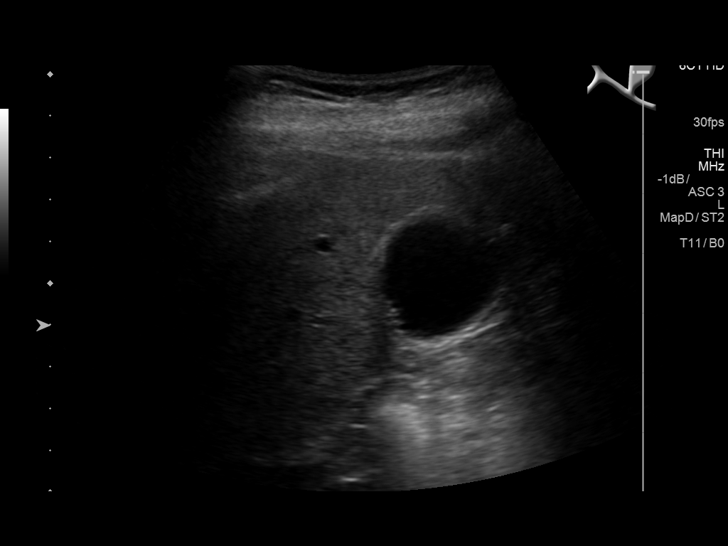
[im 11/61]
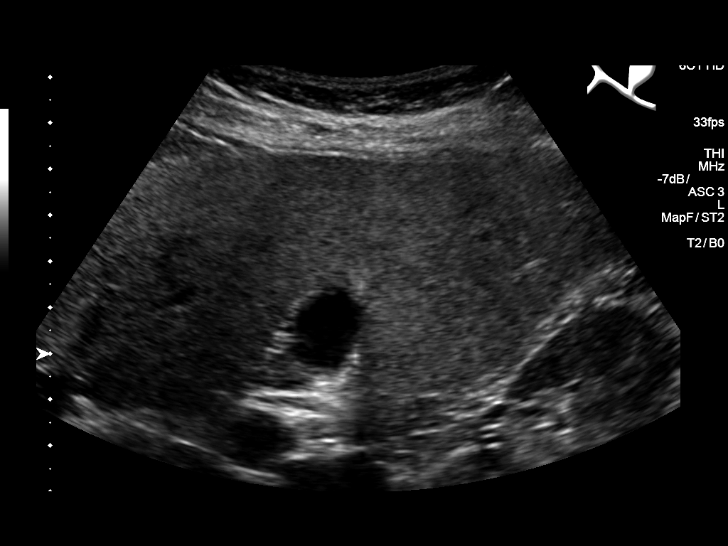
[im 16/61]
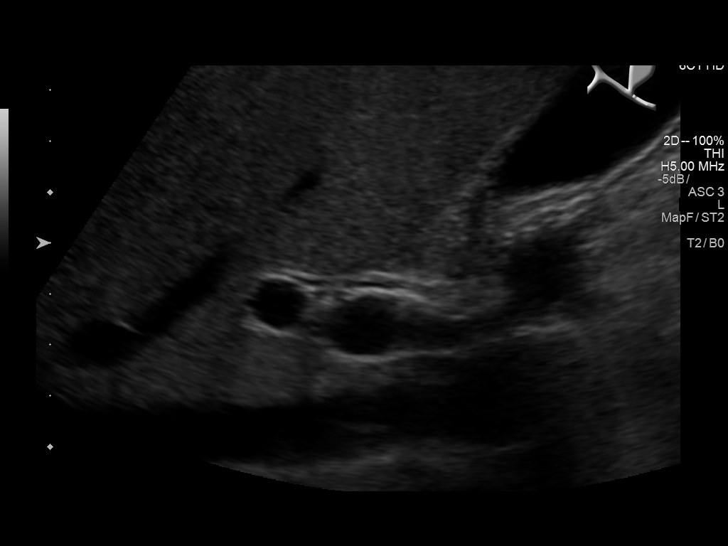
[im 21/61]
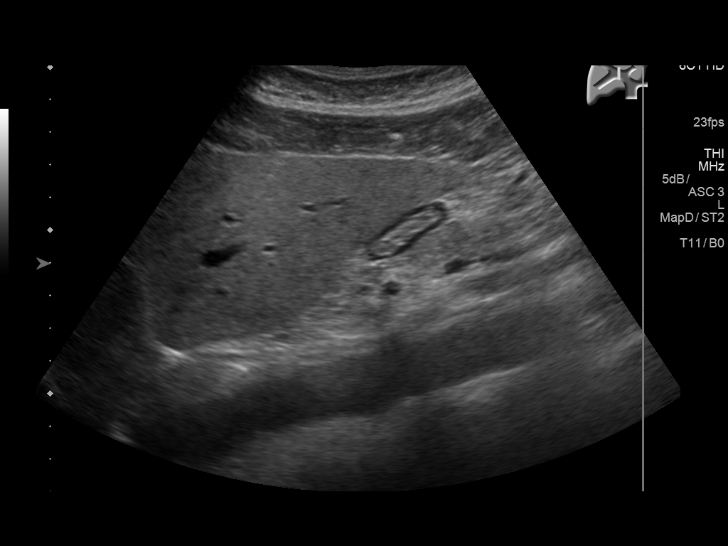
[im 23/61]
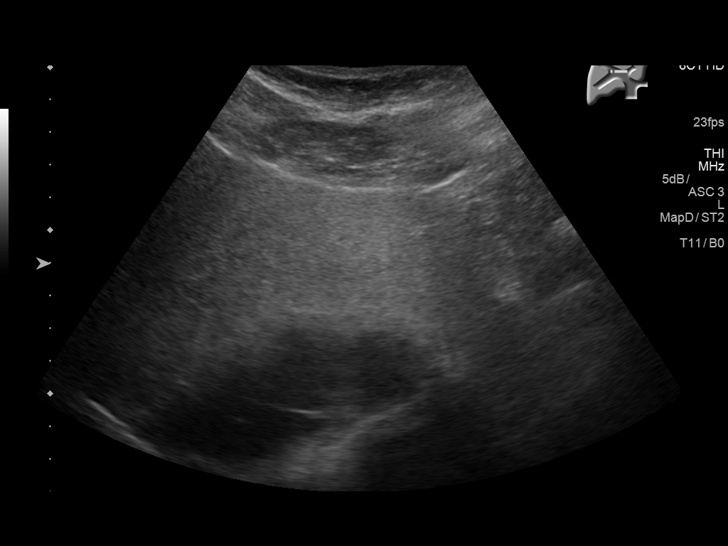
[im 28/61]
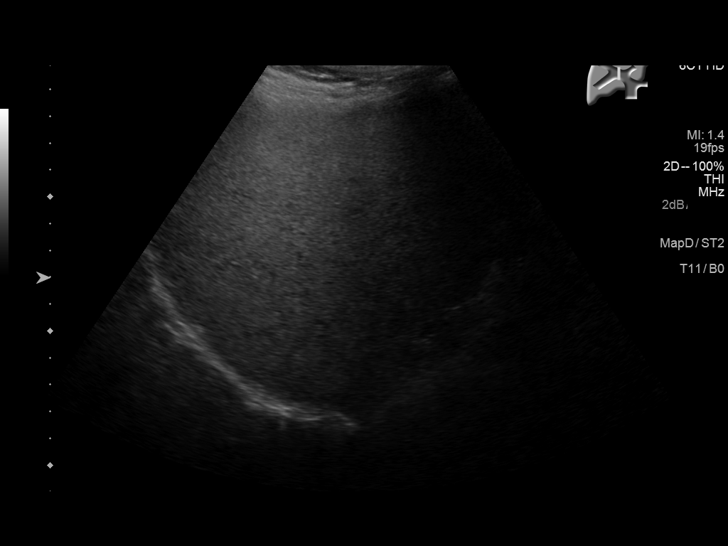
[im 33/61]
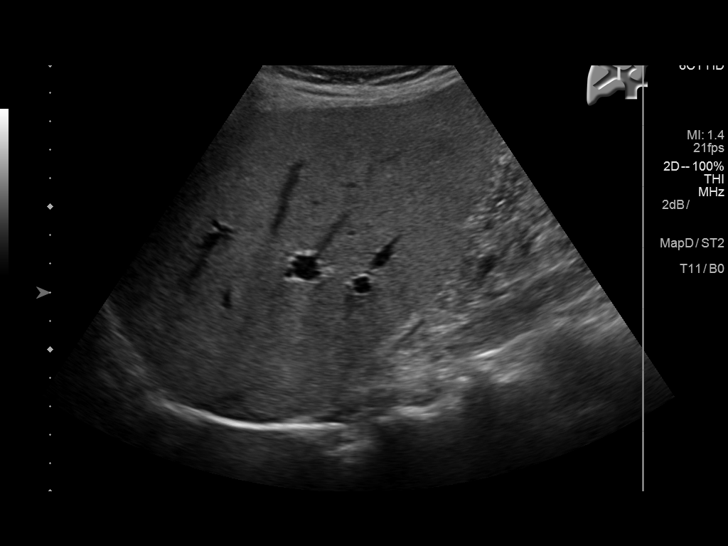
[im 38/61]
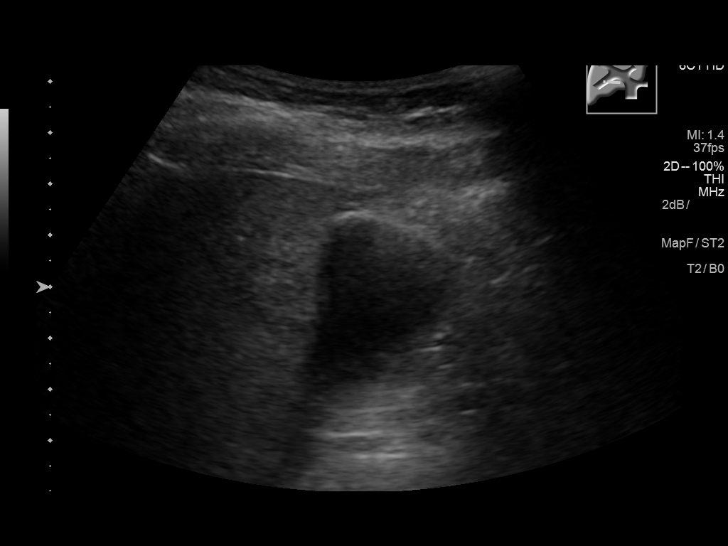
[im 41/61]
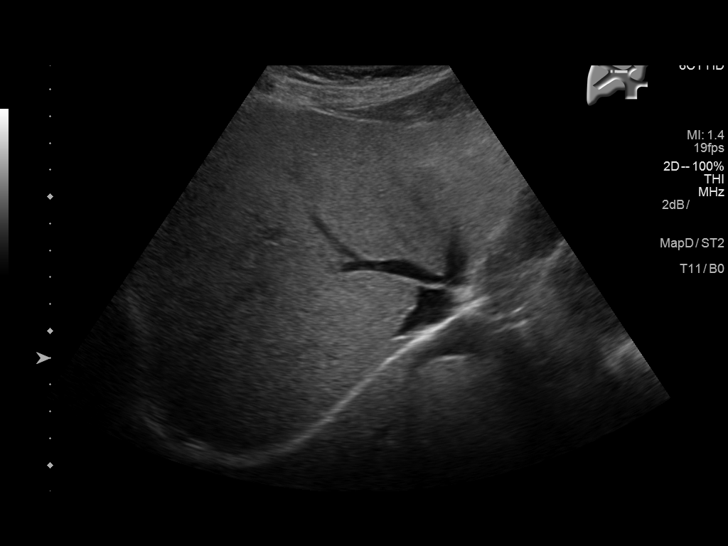
[im 46/61]
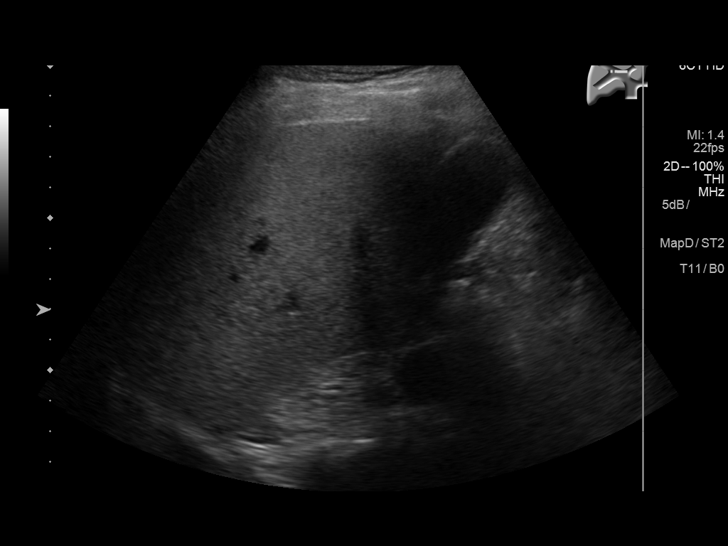
[im 51/61]
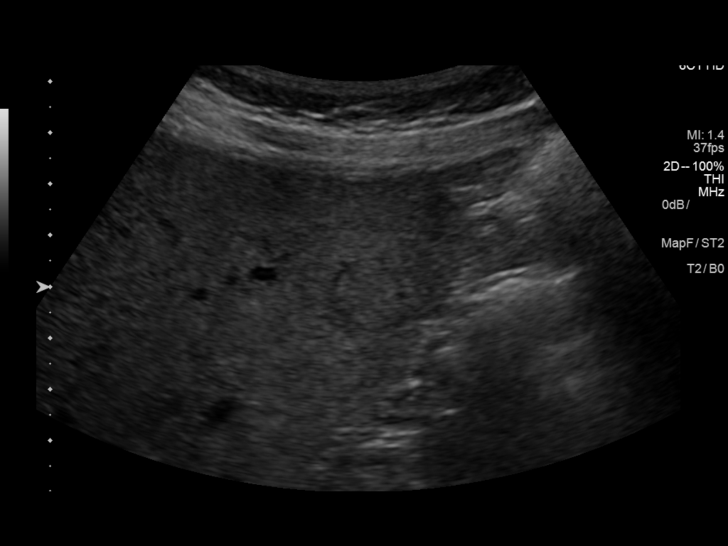
[im 56/61]
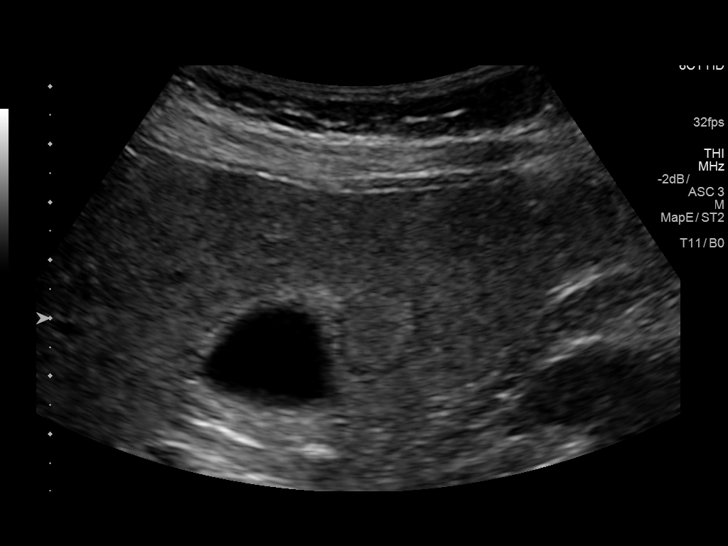
[im 61/61]
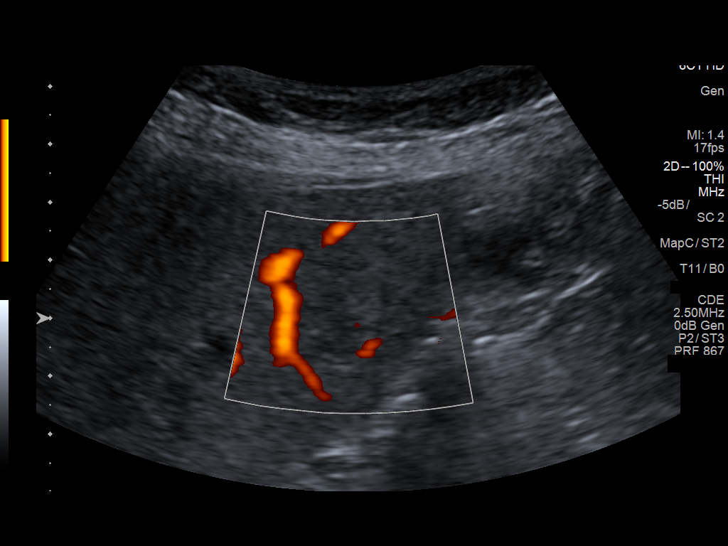

[14 of 25 positions shown; findings below may reference images not displayed]

FINDINGS: Gallbladder:

No gallstones or wall thickening visualized. No sonographic Murphy
sign noted by sonographer.

Common bile duct:

Diameter: Normal caliber, 2 mm

Liver:

Increased echotexture throughout the liver compatible with fatty
infiltration. Again noted is the lesion within the right hepatic
lobe adjacent to the gallbladder fossa with isoechoic echotexture
centrally and slight hypoechoic rim. This has stable appearance
since prior study. This measures 1.7 x 1.6 x 1.6 cm compared with
1.8 cm maximally on prior study.
IMPRESSION: Stable isoechoic nodule within the right hepatic lobe with
hypoechoic rim.

Fatty infiltration of the liver.

## 2018-03-03 ENCOUNTER — Other Ambulatory Visit: Payer: Self-pay | Admitting: Internal Medicine

## 2018-03-03 NOTE — Telephone Encounter (Signed)
Last filled 01-22-18 #20 Last OV 01-22-18 Next OV 11-12-18 [facility]

## 2018-03-30 ENCOUNTER — Other Ambulatory Visit: Payer: Self-pay | Admitting: Internal Medicine

## 2018-03-30 NOTE — Telephone Encounter (Signed)
Last office visit 01/22/2018 for CPE.  Last refilled 03/03/2018 for #20 with no refills.  Next Appt: 11/12/2018 for CPE.

## 2018-04-14 ENCOUNTER — Other Ambulatory Visit: Payer: Self-pay | Admitting: Family Medicine

## 2018-05-17 ENCOUNTER — Other Ambulatory Visit: Payer: Self-pay | Admitting: Family Medicine

## 2018-05-17 NOTE — Telephone Encounter (Signed)
Last office visit 01/22/2018 for CPE.  Last refilled 03/30/2018 for #20 with no refills.  CPE scheduled for 11/23/2018.

## 2018-06-28 ENCOUNTER — Other Ambulatory Visit: Payer: Self-pay | Admitting: Internal Medicine

## 2018-06-28 NOTE — Telephone Encounter (Signed)
Last filled 05-17-18 #20 Last OV 01-22-18 Next OV 11-23-18 [facility]

## 2018-07-28 ENCOUNTER — Other Ambulatory Visit: Payer: Self-pay | Admitting: Internal Medicine

## 2018-08-05 ENCOUNTER — Other Ambulatory Visit: Payer: Self-pay | Admitting: Internal Medicine

## 2018-08-06 NOTE — Telephone Encounter (Signed)
Last filled 06-28-18 #30 Last OV 01-22-18 Next OV 11-23-18 [facility]

## 2018-09-24 ENCOUNTER — Other Ambulatory Visit: Payer: Self-pay | Admitting: Internal Medicine

## 2018-09-24 NOTE — Telephone Encounter (Signed)
Last filled 08-06-18 #20 Last OV 01-22-18 Next OV 11-23-18 [facility]

## 2018-11-12 ENCOUNTER — Encounter: Payer: Managed Care, Other (non HMO) | Admitting: Internal Medicine

## 2018-11-23 ENCOUNTER — Other Ambulatory Visit: Payer: Self-pay

## 2018-11-23 ENCOUNTER — Encounter: Payer: Self-pay | Admitting: Internal Medicine

## 2018-11-23 ENCOUNTER — Ambulatory Visit (INDEPENDENT_AMBULATORY_CARE_PROVIDER_SITE_OTHER): Payer: Managed Care, Other (non HMO) | Admitting: Internal Medicine

## 2018-11-23 VITALS — BP 122/80 | HR 84 | Temp 98.6°F | Ht 65.0 in | Wt 152.0 lb

## 2018-11-23 DIAGNOSIS — Z23 Encounter for immunization: Secondary | ICD-10-CM | POA: Diagnosis not present

## 2018-11-23 DIAGNOSIS — L509 Urticaria, unspecified: Secondary | ICD-10-CM

## 2018-11-23 DIAGNOSIS — Z0000 Encounter for general adult medical examination without abnormal findings: Secondary | ICD-10-CM | POA: Diagnosis not present

## 2018-11-23 DIAGNOSIS — L500 Allergic urticaria: Secondary | ICD-10-CM | POA: Insufficient documentation

## 2018-11-23 DIAGNOSIS — F419 Anxiety disorder, unspecified: Secondary | ICD-10-CM | POA: Diagnosis not present

## 2018-11-23 DIAGNOSIS — I10 Essential (primary) hypertension: Secondary | ICD-10-CM

## 2018-11-23 DIAGNOSIS — Z1211 Encounter for screening for malignant neoplasm of colon: Secondary | ICD-10-CM

## 2018-11-23 MED ORDER — ALPRAZOLAM 0.25 MG PO TABS: 0.2500 mg | ORAL_TABLET | Freq: Two times a day (BID) | ORAL | 0 refills | Status: DC | PRN

## 2018-11-23 NOTE — Addendum Note (Signed)
Addended by: Pilar Grammes on: 11/23/2018 02:45 PM   Modules accepted: Orders

## 2018-11-23 NOTE — Assessment & Plan Note (Signed)
BP Readings from Last 3 Encounters:  11/23/18 122/80  01/22/18 124/82  01/13/17 128/86   Good control on low dose losartan

## 2018-11-23 NOTE — Patient Instructions (Signed)
Please try cetirizine 10mg  or fexofenadine 180mg  daily for the hives. You can call [place] [facility] for an evaluation.

## 2018-11-23 NOTE — Assessment & Plan Note (Signed)
Healthy Discussed adding resistance exercise to her walking Will set up colon Due for mammogram next month Gyn probably next year again Flu vaccine today Will defer shingrix

## 2018-11-23 NOTE — Assessment & Plan Note (Signed)
Uses xanax occasionally

## 2018-11-23 NOTE — Progress Notes (Signed)
Subjective:    Patient ID: Madeline Reid, female    DOB: 03-26-1970, 48 y.o.   MRN: [id number]  HPI Here for physical  Has been having recurrent hives--since February Has gotten topical cortisone through [facility] (on line) She shows pictures of widespread urticaria  There one day--gone the next Hasn't moved--no new exposures  Still gets her nerves acting up at times Uses the xanax prn--not that often (will feel heart race, etc) LMP last month Hasn't seen the gyn since 2018  Current Outpatient Medications on File Prior to Visit  Medication Sig Dispense Refill  . ALPRAZolam (XANAX) 0.25 MG tablet TAKE 1 TABLET (0.25 MG TOTAL) BY MOUTH 2 (TWO) TIMES DAILY AS NEEDED FOR ANXIETY. 20 tablet 0  . losartan (COZAAR) 25 MG tablet TAKE 1 TABLET BY MOUTH  DAILY 90 tablet 1  . [DISCONTINUED] omeprazole (PRILOSEC) 20 MG capsule Take 20 mg by mouth daily.       No current facility-administered medications on file prior to visit.     Allergies  Allergen Reactions  . Lisinopril     Potential cough    Past Medical History:  Diagnosis Date  . Endometriosis   . GERD (gastroesophageal reflux disease)   . Hypertension     Past Surgical History:  Procedure Laterality Date  . LAPAROSCOPY  1992   endometriosis  . MYOMECTOMY  2007   removed    Family History  Problem Relation Age of Onset  . Cancer Paternal Grandfather        throat cancer  . Coronary artery disease Maternal Grandmother   . Coronary artery disease Paternal Grandmother   . Colon cancer Neg Hx   . Breast cancer Neg Hx     Social History   Socioeconomic History  . Marital status: Married    Spouse name: Not on file  . Number of children: 2  . Years of education: Not on file  . Highest education level: Not on file  Occupational History  . Occupation: Engineer, agricultural: [employer]  Social Needs  . Financial resource strain: Not on file  . Food insecurity    Worry: Not on file   Inability: Not on file  . Transportation needs    Medical: Not on file    Non-medical: Not on file  Tobacco Use  . Smoking status: Former Smoker    Types: Cigarettes    Quit date: 08/28/2006    Years since quitting: 12.2  . Smokeless tobacco: Never Used  Substance and Sexual Activity  . Alcohol use: Yes    Comment: occ  . Drug use: No  . Sexual activity: Not on file  Lifestyle  . Physical activity    Days per week: Not on file    Minutes per session: Not on file  . Stress: Not on file  Relationships  . Social Herbalist on phone: Not on file    Gets together: Not on file    Attends religious service: Not on file    Active member of club or organization: Not on file    Attends meetings of clubs or organizations: Not on file    Relationship status: Not on file  . Intimate partner violence    Fear of current or ex partner: Not on file    Emotionally abused: Not on file    Physically abused: Not on file    Forced sexual activity: Not on file  Other Topics  Concern  . Not on file  Social History Narrative   Adopted 2 children in 2012            Review of Systems  Constitutional: Negative for fatigue.       Weight is down a few pounds Wears seat belt  HENT: Negative for dental problem.        Weird noises in left ear at times. Hearing is okay Keeps up with dentist  Eyes: Negative for visual disturbance.       No diplopia or unilateral vision loss  Respiratory: Negative for cough, chest tightness and shortness of breath.   Cardiovascular: Positive for palpitations. Negative for leg swelling.       Gets fleeting sensation in chest ---associated with anxiety (gone in seconds)  Gastrointestinal: Negative for abdominal pain, blood in stool and constipation.       No heartburn  Endocrine: Negative for polydipsia and polyuria.  Genitourinary: Negative for difficulty urinating, dyspareunia, dysuria and hematuria.  Musculoskeletal: Negative for back pain and joint  swelling.       Takes tumeric for right shoulder pain  Skin:       No other skin lesions  Allergic/Immunologic: Negative for environmental allergies and immunocompromised state.  Neurological: Negative for dizziness, syncope, light-headedness and headaches.  Hematological: Negative for adenopathy. Does not bruise/bleed easily.  Psychiatric/Behavioral: Negative for dysphoric mood. The patient is nervous/anxious.        Awakens often--- temperature changes  No regular daytime somnolence       Objective:   Physical Exam  Constitutional: She is oriented to person, place, and time. She appears well-developed. No distress.  HENT:  Head: Normocephalic and atraumatic.  Right Ear: External ear normal.  Left Ear: External ear normal.  Mouth/Throat: Oropharynx is clear and moist. No oropharyngeal exudate.  Eyes: Pupils are equal, round, and reactive to light. Conjunctivae are normal.  Neck: No thyromegaly present.  Cardiovascular: Normal rate, regular rhythm, normal heart sounds and intact distal pulses. Exam reveals no gallop.  No murmur heard. Respiratory: Effort normal and breath sounds normal. No respiratory distress. She has no wheezes. She has no rales.  GI: Soft. There is no abdominal tenderness.  Musculoskeletal:        General: No tenderness or edema.  Lymphadenopathy:    She has no cervical adenopathy.  Neurological: She is alert and oriented to person, place, and time.  Skin: No rash noted. No erythema.  Psychiatric: She has a normal mood and affect. Her behavior is normal.           Assessment & Plan:

## 2018-11-23 NOTE — Assessment & Plan Note (Signed)
Recurrent Discussed going to see allergist

## 2018-11-24 ENCOUNTER — Other Ambulatory Visit: Payer: Self-pay

## 2018-11-24 ENCOUNTER — Telehealth: Payer: Self-pay | Admitting: Internal Medicine

## 2018-11-24 DIAGNOSIS — Z1211 Encounter for screening for malignant neoplasm of colon: Secondary | ICD-10-CM

## 2018-11-24 DIAGNOSIS — R7989 Other specified abnormal findings of blood chemistry: Secondary | ICD-10-CM

## 2018-11-24 LAB — CBC
Hematocrit: 42.5 % (ref 34.0–46.6)
Hemoglobin: 14.5 g/dL (ref 11.1–15.9)
MCH: 31.7 pg (ref 26.6–33.0)
MCHC: 34.1 g/dL (ref 31.5–35.7)
MCV: 93 fL (ref 79–97)
Platelets: 302 10*3/uL (ref 150–450)
RBC: 4.57 x10E6/uL (ref 3.77–5.28)
RDW: 12.9 % (ref 11.7–15.4)
WBC: 5 10*3/uL (ref 3.4–10.8)

## 2018-11-24 LAB — LIPID PANEL
Chol/HDL Ratio: 2.9 ratio (ref 0.0–4.4)
Cholesterol, Total: 278 mg/dL — ABNORMAL HIGH (ref 100–199)
HDL: 97 mg/dL (ref >39)
LDL Chol Calc (NIH): 131 mg/dL — ABNORMAL HIGH (ref 0–99)
Triglycerides: 290 mg/dL — ABNORMAL HIGH (ref 0–149)
VLDL Cholesterol Cal: 50 mg/dL — ABNORMAL HIGH (ref 5–40)

## 2018-11-24 LAB — COMPREHENSIVE METABOLIC PANEL
ALT: 182 IU/L — ABNORMAL HIGH (ref 0–32)
AST: 99 IU/L — ABNORMAL HIGH (ref 0–40)
Albumin/Globulin Ratio: 2 (ref 1.2–2.2)
Albumin: 5.2 g/dL — ABNORMAL HIGH (ref 3.8–4.8)
Alkaline Phosphatase: 84 IU/L (ref 39–117)
BUN/Creatinine Ratio: 15 (ref 9–23)
BUN: 10 mg/dL (ref 6–24)
Bilirubin Total: 0.3 mg/dL (ref 0.0–1.2)
CO2: 24 mmol/L (ref 20–29)
Calcium: 10.6 mg/dL — ABNORMAL HIGH (ref 8.7–10.2)
Chloride: 99 mmol/L (ref 96–106)
Creatinine, Ser: 0.67 mg/dL (ref 0.57–1.00)
GFR calc Af Amer: 119 mL/min/{1.73_m2} (ref >59)
GFR calc non Af Amer: 103 mL/min/{1.73_m2} (ref >59)
Globulin, Total: 2.6 g/dL (ref 1.5–4.5)
Glucose: 106 mg/dL — ABNORMAL HIGH (ref 65–99)
Potassium: 4.1 mmol/L (ref 3.5–5.2)
Sodium: 141 mmol/L (ref 134–144)
Total Protein: 7.8 g/dL (ref 6.0–8.5)

## 2018-11-24 LAB — SEDIMENTATION RATE: Sed Rate: 50 mm/hr — ABNORMAL HIGH (ref 0–40)

## 2018-11-24 NOTE — Telephone Encounter (Signed)
I left a detailed message on patient's voice mail to call back and schedule lab appointment in 4-6 weeks.

## 2018-11-25 ENCOUNTER — Telehealth: Payer: Self-pay | Admitting: Gastroenterology

## 2018-11-25 ENCOUNTER — Telehealth: Payer: Self-pay

## 2018-11-25 NOTE — Telephone Encounter (Signed)
Pt  Left vm for Sharyn Lull to please call her around 10 am this morning

## 2018-11-25 NOTE — Telephone Encounter (Signed)
Returned patients call at 10am-LVM for her to call me back if she has questions regarding colonoscopy.  Thanks [employer]

## 2018-12-01 ENCOUNTER — Other Ambulatory Visit: Payer: Self-pay

## 2018-12-01 ENCOUNTER — Other Ambulatory Visit (INDEPENDENT_AMBULATORY_CARE_PROVIDER_SITE_OTHER): Payer: Managed Care, Other (non HMO)

## 2018-12-01 DIAGNOSIS — R7989 Other specified abnormal findings of blood chemistry: Secondary | ICD-10-CM

## 2018-12-02 ENCOUNTER — Telehealth: Payer: Self-pay

## 2018-12-02 LAB — LIPID PANEL
Chol/HDL Ratio: 4.1 ratio (ref 0.0–4.4)
Cholesterol, Total: 264 mg/dL — ABNORMAL HIGH (ref 100–199)
HDL: 64 mg/dL (ref >39)
LDL Chol Calc (NIH): 171 mg/dL — ABNORMAL HIGH (ref 0–99)
Triglycerides: 163 mg/dL — ABNORMAL HIGH (ref 0–149)
VLDL Cholesterol Cal: 29 mg/dL (ref 5–40)

## 2018-12-02 LAB — HEPATIC FUNCTION PANEL
ALT: 83 IU/L — ABNORMAL HIGH (ref 0–32)
AST: 43 IU/L — ABNORMAL HIGH (ref 0–40)
Albumin: 4.8 g/dL (ref 3.8–4.8)
Alkaline Phosphatase: 81 IU/L (ref 39–117)
Bilirubin Total: 0.3 mg/dL (ref 0.0–1.2)
Bilirubin, Direct: 0.1 mg/dL (ref 0.00–0.40)
Total Protein: 7.4 g/dL (ref 6.0–8.5)

## 2018-12-02 LAB — HEPATITIS PANEL, ACUTE
Hep A IgM: NEGATIVE
Hep B C IgM: NEGATIVE
Hep C Virus Ab: <0.1 s/co ratio (ref 0.0–0.9)
Hepatitis B Surface Ag: NEGATIVE

## 2018-12-02 NOTE — Telephone Encounter (Signed)
Patient contacted office to cancel her 12/16/18 colonoscopy with Dr. Bonna Gains due to having plans.  She will call to reschedule at a later time.  Trish in Endo has been informed of cancellation.  Thanks [employer]

## 2018-12-16 ENCOUNTER — Ambulatory Visit: Admit: 2018-12-16 | Payer: Managed Care, Other (non HMO) | Admitting: Gastroenterology

## 2018-12-16 SURGERY — COLONOSCOPY WITH PROPOFOL
Anesthesia: General

## 2018-12-20 ENCOUNTER — Other Ambulatory Visit: Payer: Self-pay

## 2018-12-20 ENCOUNTER — Ambulatory Visit: Payer: Managed Care, Other (non HMO) | Admitting: Allergy and Immunology

## 2018-12-20 ENCOUNTER — Encounter: Payer: Self-pay | Admitting: Allergy and Immunology

## 2018-12-20 VITALS — BP 124/78 | HR 102 | Temp 98.2°F | Resp 16 | Ht 64.5 in | Wt 149.0 lb

## 2018-12-20 DIAGNOSIS — L2089 Other atopic dermatitis: Secondary | ICD-10-CM

## 2018-12-20 DIAGNOSIS — L500 Allergic urticaria: Secondary | ICD-10-CM

## 2018-12-20 DIAGNOSIS — K2970 Gastritis, unspecified, without bleeding: Secondary | ICD-10-CM

## 2018-12-20 DIAGNOSIS — T7840XD Allergy, unspecified, subsequent encounter: Secondary | ICD-10-CM | POA: Diagnosis not present

## 2018-12-20 DIAGNOSIS — J3089 Other allergic rhinitis: Secondary | ICD-10-CM | POA: Diagnosis not present

## 2018-12-20 DIAGNOSIS — T7840XA Allergy, unspecified, initial encounter: Secondary | ICD-10-CM | POA: Insufficient documentation

## 2018-12-20 HISTORY — DX: Other atopic dermatitis: L20.89

## 2018-12-20 MED ORDER — FAMOTIDINE 20 MG PO TABS: 20.0000 mg | ORAL_TABLET | Freq: Two times a day (BID) | ORAL | 5 refills | Status: DC

## 2018-12-20 MED ORDER — DESONIDE 0.05 % EX OINT: 1.0000 "application " | TOPICAL_OINTMENT | Freq: Two times a day (BID) | CUTANEOUS | 5 refills | Status: DC | PRN

## 2018-12-20 MED ORDER — FLUTICASONE PROPIONATE 50 MCG/ACT NA SUSP: 2.0000 | Freq: Every day | NASAL | 5 refills | Status: DC | PRN

## 2018-12-20 MED ORDER — LEVOCETIRIZINE DIHYDROCHLORIDE 5 MG PO TABS: 5.0000 mg | ORAL_TABLET | Freq: Every day | ORAL | 5 refills | Status: DC | PRN

## 2018-12-20 NOTE — Addendum Note (Signed)
Addended by: Chip Boer R on: 12/20/2018 04:49 PM   Modules accepted: Orders

## 2018-12-20 NOTE — Patient Instructions (Addendum)
Recurrent urticaria Unclear etiology. Skin tests to select food allergens were negative today. NSAIDs and emotional stress commonly exacerbate urticaria but are not the underlying etiology in this case. Physical urticarias are negative by history (i.e. pressure-induced, temperature, vibration, solar, etc.). History and lesions are not consistent with urticaria pigmentosa so I am not suspicious for mastocytosis. There are no concomitant symptoms concerning for anaphylaxis. We will rule out other potential etiologies with labs. For symptom relief, patient is to take oral antihistamines as directed.  The following labs have been ordered: FCeRI antibody, anti-thyroglobulin antibody, thyroid peroxidase antibody, tryptase, H. pylori serology, CBC, CMP, ANA, and alpha-gal panel.  Sedimentation rate was recently checked and found to be elevated at 50.  The patient will be called with further recommendations after lab results have returned.  Instructions have been discussed and provided for H1/H2 receptor blockade with titration to find lowest effective dose.  A prescription has been provided for levocetirizine (Xyzal), 5 mg daily as needed.  A prescription has been provided for famotidine (Pepcid), 20mg  twice daily as needed.  Should there be a significant increase or change in symptoms, a journal is to be kept recording any foods eaten, beverages consumed, medications taken within a 6 hour period prior to the onset of symptoms, as well as record activities being performed, and environmental conditions. For any symptoms concerning for anaphylaxis, 911 is to be called immediately.  Atopic dermatitis versus contact dermatitis This problem resolved and has been quiescent over the past several months.  The history suggests atopic dermatitis versus allergic contact dermatitis.  If this problem recurs we will proceed to product patch testing.  Should this problem recur, a prescription has been provided for  desonide 0.05% ointment sparingly to affected areas twice daily as needed.  Perennial allergic rhinitis  Aeroallergen avoidance measures have been discussed and provided in written form.  Levocetirizine has been prescribed (as above).  A prescription has been provided for fluticasone nasal spray, 2 sprays per nostril daily as needed.   Nasal saline spray (i.e. Simply Saline) is recommended prior to medicated nasal sprays and as needed.   When lab results have returned you will be called with further recommendations. With the newly implemented Cures Act, the labs may be visible to you at the same time they become visible to [place]. However, the results will typically not be addressed until all of the results are back, so please be patient.  Until you have heard from [place], please continue the treatment plan as outlined on your take home sheet.  Urticaria (Hives)  . Levocetirizine (Xyzal) 5 mg twice a day and famotidine (Pepcid) 20 mg twice a day. If no symptoms for 7-14 days then decrease to. . Levocetirizine (Xyzal) 5 mg twice a day and famotidine (Pepcid) 20 mg once a day.  If no symptoms for 7-14 days then decrease to. . Levocetirizine (Xyzal) 5 mg twice a day.  If no symptoms for 7-14 days then decrease to. . Levocetirizine (Xyzal) 5 mg once a day.  May use Benadryl (diphenhydramine) as needed for breakthrough symptoms       If symptoms return, then step up dosage  Control of [place] dust mites play a major role in allergic asthma and rhinitis.  They occur in environments with high humidity wherever human skin, the food for dust mites is found. High levels have been detected in dust obtained from mattresses, pillows, carpets, upholstered furniture, bed covers, clothes and soft toys.  The principal  allergen of the house dust mite is found in its feces.  A gram of dust may contain 1,000 mites and 250,000 fecal particles.  Mite antigen is easily measured in the air  during house cleaning activities.    1. Encase mattresses, including the box spring, and pillow, in an air tight cover.  Seal the zipper end of the encased mattresses with wide adhesive tape. 2. Wash the bedding in water of 130 degrees Farenheit weekly.  Avoid cotton comforters/quilts and flannel bedding: the most ideal bed covering is the dacron comforter. 3. Remove all upholstered furniture from the bedroom. 4. Remove carpets, carpet padding, rugs, and non-washable window drapes from the bedroom.  Wash drapes weekly or use plastic window coverings. 5. Remove all non-washable stuffed toys from the bedroom.  Wash stuffed toys weekly. 6. Have the room cleaned frequently with a vacuum cleaner and a damp dust-mop.  The patient should not be in a room which is being cleaned and should wait 1 hour after cleaning before going into the room. 7. Close and seal all heating outlets in the bedroom.  Otherwise, the room will become filled with dust-laden air.  An electric heater can be used to heat the room. 8. Reduce indoor humidity to less than 50%.  Do not use a humidifier.

## 2018-12-20 NOTE — Assessment & Plan Note (Signed)
This problem resolved and has been quiescent over the past several months.  The history suggests atopic dermatitis versus allergic contact dermatitis.  If this problem recurs we will proceed to product patch testing.  Should this problem recur, a prescription has been provided for desonide 0.05% ointment sparingly to affected areas twice daily as needed.

## 2018-12-20 NOTE — Assessment & Plan Note (Signed)
   Aeroallergen avoidance measures have been discussed and provided in written form.  Levocetirizine has been prescribed (as above).  A prescription has been provided for fluticasone nasal spray, 2 sprays per nostril daily as needed.   Nasal saline spray (i.e. Simply Saline) is recommended prior to medicated nasal sprays and as needed.

## 2018-12-20 NOTE — Progress Notes (Signed)
New Patient Note  RE: Madeline Reid MRN: [id number] DOB: 1970-08-09 Date of Office Visit: 12/20/2018  Referring provider: Venia Carbon, MD Primary care provider: Venia Carbon, MD  Chief Complaint: Urticaria and Rash   History of present illness: Madeline Reid is a 48 y.o. female seen today in consultation requested by Viviana Simpler, MD.  Over the past 2 months, Madeline Reid has experienced recurrent episodes of hives. The hives have appeared at different times over her face, neck, chest, arms and abdomen/torso.  The lesions are described as erythematous, raised, and pruritic.  Individual hives last less than 24 hours without leaving residual pigmentation or bruising. She denies concomitant angioedema, cardiopulmonary symptoms and GI symptoms. She has not experienced unexpected weight loss, recurrent fevers or drenching night sweats. No specific medication, food, skin care product, detergent, soap, or other environmental triggers have been identified. The symptoms do not seem to correlate with NSAIDs use or emotional stress. She did not have symptoms consistent with a respiratory tract infection at the time of symptom onset. Madeline Reid has tried to control symptoms with OTC antihistamines which have offered adequate temporary relief. She has not been evaluated and treated in the emergency department for these symptoms. Skin biopsy has not been performed. Recently, she has been experiencing hives once a week on average. She reports that in March 2020 she developed a red rash on her eyelids, neck, chest, antecubital fossae, and popliteal fossae.  The rash did go confused to urticaria or vesicles.  The rash was more burning than itching.  This particular rash resolved in July without recurrence. Madeline Reid experiences nasal congestion, rhinorrhea, sneezing, nasal pruritus, and ocular pruritus.  No specific environmental triggers have been identified.  The nasal and ocular symptoms seem to be most  pronounced during the fall.  She occasionally requires antihistamines to help alleviate the symptoms.  Assessment and plan: Recurrent urticaria Unclear etiology. Skin tests to select food allergens were negative today. NSAIDs and emotional stress commonly exacerbate urticaria but are not the underlying etiology in this case. Physical urticarias are negative by history (i.e. pressure-induced, temperature, vibration, solar, etc.). History and lesions are not consistent with urticaria pigmentosa so I am not suspicious for mastocytosis. There are no concomitant symptoms concerning for anaphylaxis. We will rule out other potential etiologies with labs. For symptom relief, patient is to take oral antihistamines as directed.  The following labs have been ordered: FCeRI antibody, anti-thyroglobulin antibody, thyroid peroxidase antibody, tryptase, H. pylori serology, CBC, CMP, ANA, and alpha-gal panel.  Sedimentation rate was recently checked and found to be elevated at 50.  The patient will be called with further recommendations after lab results have returned.  Instructions have been discussed and provided for H1/H2 receptor blockade with titration to find lowest effective dose.  A prescription has been provided for levocetirizine (Xyzal), 5 mg daily as needed.  A prescription has been provided for famotidine (Pepcid), 20mg  twice daily as needed.  Should there be a significant increase or change in symptoms, a journal is to be kept recording any foods eaten, beverages consumed, medications taken within a 6 hour period prior to the onset of symptoms, as well as record activities being performed, and environmental conditions. For any symptoms concerning for anaphylaxis, 911 is to be called immediately.  Atopic dermatitis versus contact dermatitis This problem resolved and has been quiescent over the past several months.  The history suggests atopic dermatitis versus allergic contact dermatitis.  If this  problem recurs we will proceed  to product patch testing.  Should this problem recur, a prescription has been provided for desonide 0.05% ointment sparingly to affected areas twice daily as needed.  Perennial allergic rhinitis  Aeroallergen avoidance measures have been discussed and provided in written form.  Levocetirizine has been prescribed (as above).  A prescription has been provided for fluticasone nasal spray, 2 sprays per nostril daily as needed.   Nasal saline spray (i.e. Simply Saline) is recommended prior to medicated nasal sprays and as needed.   Meds ordered this encounter  Medications  . levocetirizine (XYZAL) 5 MG tablet    Sig: Take 1 tablet (5 mg total) by mouth daily as needed for allergies.    Dispense:  30 tablet    Refill:  5  . famotidine (PEPCID) 20 MG tablet    Sig: Take 1 tablet (20 mg total) by mouth 2 (two) times daily.    Dispense:  60 tablet    Refill:  5  . desonide (DESOWEN) 0.05 % ointment    Sig: Apply 1 application topically 2 (two) times daily as needed.    Dispense:  15 g    Refill:  5  . fluticasone (FLONASE) 50 MCG/ACT nasal spray    Sig: Place 2 sprays into both nostrils daily as needed for allergies or rhinitis.    Dispense:  16 g    Refill:  5    Diagnostics: Environmental skin testing: Robust reactivity to dust mite antigen. Food allergen skin testing: Negative despite a positive histamine control.    Physical examination: Blood pressure 124/78, pulse (!) 102, temperature 98.2 F (36.8 C), temperature source Temporal, resp. rate 16, height 5' 4.5" (1.638 m), weight 149 lb (67.6 kg), SpO2 95 %.  General: Alert, interactive, in no acute distress. HEENT: TMs pearly gray, turbinates minimally edematous without discharge, post-pharynx mildly erythematous. Neck: Supple without lymphadenopathy. Lungs: Clear to auscultation without wheezing, rhonchi or rales. CV: Normal S1, S2 without murmurs. Abdomen: Nondistended, nontender. Skin:  Warm and dry, without lesions or rashes. Extremities:  No clubbing, cyanosis or edema. Neuro:   Grossly intact.  Review of systems:  Review of systems negative except as noted in HPI / PMHx or noted below: Review of Systems  Constitutional: Negative.   HENT: Negative.   Eyes: Negative.   Respiratory: Negative.   Cardiovascular: Negative.   Gastrointestinal: Negative.   Genitourinary: Negative.   Musculoskeletal: Negative.   Skin: Negative.   Neurological: Negative.   Endo/Heme/Allergies: Negative.   Psychiatric/Behavioral: Negative.     Past medical history:  Past Medical History:  Diagnosis Date  . Atopic dermatitis versus contact dermatitis 12/20/2018  . Endometriosis   . GERD (gastroesophageal reflux disease)   . Hypertension   . Urticaria     Past surgical history:  Past Surgical History:  Procedure Laterality Date  . LAPAROSCOPY  1992   endometriosis  . MYOMECTOMY  2007   removed    Family history: Family History  Problem Relation Age of Onset  . Eczema Mother   . Cancer Paternal Grandfather        throat cancer  . Coronary artery disease Maternal Grandmother   . Coronary artery disease Paternal Grandmother   . Eczema Maternal Grandfather   . Colon cancer Neg Hx   . Breast cancer Neg Hx     Social history: Social History   Socioeconomic History  . Marital status: Married    Spouse name: Not on file  . Number of children: 2  . Years of  education: Not on file  . Highest education level: Not on file  Occupational History  . Occupation: Engineer, agricultural: [employer]  Social Needs  . Financial resource strain: Not on file  . Food insecurity    Worry: Not on file    Inability: Not on file  . Transportation needs    Medical: Not on file    Non-medical: Not on file  Tobacco Use  . Smoking status: Former Smoker    Types: Cigarettes    Quit date: 08/28/2006    Years since quitting: 12.3  . Smokeless tobacco: Never Used  Substance and  Sexual Activity  . Alcohol use: Yes    Comment: occ  . Drug use: No  . Sexual activity: Not on file  Lifestyle  . Physical activity    Days per week: Not on file    Minutes per session: Not on file  . Stress: Not on file  Relationships  . Social Herbalist on phone: Not on file    Gets together: Not on file    Attends religious service: Not on file    Active member of club or organization: Not on file    Attends meetings of clubs or organizations: Not on file    Relationship status: Not on file  . Intimate partner violence    Fear of current or ex partner: Not on file    Emotionally abused: Not on file    Physically abused: Not on file    Forced sexual activity: Not on file  Other Topics Concern  . Not on file  Social History Narrative   Adopted 2 children in 2012             Environmental History: The patient lives in a 50 year old house with carpeting in the bedroom, gas heat, and central air.  There is no known mold/water damage in the home.  There is a cat in the home which does not have access to her bedroom.  She is a non-smoker.  Current Outpatient Medications  Medication Sig Dispense Refill  . ALPRAZolam (XANAX) 0.25 MG tablet Take 1 tablet (0.25 mg total) by mouth 2 (two) times daily as needed for anxiety. 20 tablet 0  . Black Elderberry (SAMBUCUS ELDERBERRY PO) Take by mouth. 500 mg zinc + vitamin c    . co-enzyme Q-10 30 MG capsule Take 100 mg by mouth daily.    Marland Kitchen KRILL OIL PO Take by mouth.    . losartan (COZAAR) 25 MG tablet TAKE 1 TABLET BY MOUTH  DAILY 90 tablet 1  . MILK THISTLE PO Take 7,500 mg by mouth daily. 80% silymarin flavonoids    . Turmeric 500 MG CAPS Take 1,000 mg by mouth daily.    Marland Kitchen desonide (DESOWEN) 0.05 % ointment Apply 1 application topically 2 (two) times daily as needed. 15 g 5  . famotidine (PEPCID) 20 MG tablet Take 1 tablet (20 mg total) by mouth 2 (two) times daily. 60 tablet 5  . fluticasone (FLONASE) 50 MCG/ACT nasal  spray Place 2 sprays into both nostrils daily as needed for allergies or rhinitis. 16 g 5  . levocetirizine (XYZAL) 5 MG tablet Take 1 tablet (5 mg total) by mouth daily as needed for allergies. 30 tablet 5   No current facility-administered medications for this visit.     Known medication allergies: Allergies  Allergen Reactions  . Lisinopril     Potential cough    I  appreciate the opportunity to take part in Shereese's care. Please do not hesitate to contact me with questions.  Sincerely,   R. Edgar Frisk, MD

## 2018-12-20 NOTE — Addendum Note (Signed)
Addended by: Chip Boer R on: 12/20/2018 04:51 PM   Modules accepted: Orders

## 2018-12-20 NOTE — Assessment & Plan Note (Signed)
Unclear etiology. Skin tests to select food allergens were negative today. NSAIDs and emotional stress commonly exacerbate urticaria but are not the underlying etiology in this case. Physical urticarias are negative by history (i.e. pressure-induced, temperature, vibration, solar, etc.). History and lesions are not consistent with urticaria pigmentosa so I am not suspicious for mastocytosis. There are no concomitant symptoms concerning for anaphylaxis. We will rule out other potential etiologies with labs. For symptom relief, patient is to take oral antihistamines as directed.  The following labs have been ordered: FCeRI antibody, anti-thyroglobulin antibody, thyroid peroxidase antibody, tryptase, H. pylori serology, CBC, CMP, ANA, and alpha-gal panel.  Sedimentation rate was recently checked and found to be elevated at 50.  The patient will be called with further recommendations after lab results have returned.  Instructions have been discussed and provided for H1/H2 receptor blockade with titration to find lowest effective dose.  A prescription has been provided for levocetirizine (Xyzal), 5 mg daily as needed.  A prescription has been provided for famotidine (Pepcid), 20mg  twice daily as needed.  Should there be a significant increase or change in symptoms, a journal is to be kept recording any foods eaten, beverages consumed, medications taken within a 6 hour period prior to the onset of symptoms, as well as record activities being performed, and environmental conditions. For any symptoms concerning for anaphylaxis, 911 is to be called immediately.

## 2018-12-21 ENCOUNTER — Telehealth: Payer: Self-pay | Admitting: Internal Medicine

## 2018-12-21 NOTE — Telephone Encounter (Signed)
I left a message on patient's voice mail to call back and schedule virtual or in office visit with Dr.Letvak.  Dr.Letvak wants to discuss her lab results.

## 2018-12-29 ENCOUNTER — Telehealth: Payer: Self-pay

## 2018-12-29 LAB — ALPHA-GAL PANEL
Alpha Gal IgE*: 0.13 kU/L — ABNORMAL HIGH (ref <0.10)
Beef (Bos spp) IgE: <0.1 kU/L (ref <0.35)
Class Interpretation: 0
Class Interpretation: 0
Class Interpretation: 0
Lamb/Mutton (Ovis spp) IgE: <0.1 kU/L (ref <0.35)
Pork (Sus spp) IgE: <0.1 kU/L (ref <0.35)

## 2018-12-29 LAB — CBC WITH DIFFERENTIAL/PLATELET
Basophils Absolute: 0.1 10*3/uL (ref 0.0–0.2)
Basos: 1 %
EOS (ABSOLUTE): 0.1 10*3/uL (ref 0.0–0.4)
Eos: 2 %
Hematocrit: 40.6 % (ref 34.0–46.6)
Hemoglobin: 13.9 g/dL (ref 11.1–15.9)
Immature Grans (Abs): 0 10*3/uL (ref 0.0–0.1)
Immature Granulocytes: 1 %
Lymphocytes Absolute: 1.6 10*3/uL (ref 0.7–3.1)
Lymphs: 25 %
MCH: 31.7 pg (ref 26.6–33.0)
MCHC: 34.2 g/dL (ref 31.5–35.7)
MCV: 93 fL (ref 79–97)
Monocytes Absolute: 0.6 10*3/uL (ref 0.1–0.9)
Monocytes: 9 %
Neutrophils Absolute: 4 10*3/uL (ref 1.4–7.0)
Neutrophils: 62 %
Platelets: 262 10*3/uL (ref 150–450)
RBC: 4.38 x10E6/uL (ref 3.77–5.28)
RDW: 13 % (ref 11.7–15.4)
WBC: 6.4 10*3/uL (ref 3.4–10.8)

## 2018-12-29 LAB — ENA+DNA/DS+SJORGEN'S
ENA RNP Ab: 1 AI — ABNORMAL HIGH (ref 0.0–0.9)
ENA SM Ab Ser-aCnc: <0.2 AI (ref 0.0–0.9)
ENA SSA (RO) Ab: <0.2 AI (ref 0.0–0.9)
ENA SSB (LA) Ab: <0.2 AI (ref 0.0–0.9)
dsDNA Ab: 6 IU/mL (ref 0–9)

## 2018-12-29 LAB — TRYPTASE: Tryptase: 5.8 ug/L (ref 2.2–13.2)

## 2018-12-29 LAB — THYROGLOBULIN ANTIBODY: Thyroglobulin Antibody: <1 IU/mL (ref 0.0–0.9)

## 2018-12-29 LAB — COMPREHENSIVE METABOLIC PANEL
ALT: 195 IU/L — ABNORMAL HIGH (ref 0–32)
AST: 133 IU/L — ABNORMAL HIGH (ref 0–40)
Albumin/Globulin Ratio: 2.5 — ABNORMAL HIGH (ref 1.2–2.2)
Albumin: 5.4 g/dL — ABNORMAL HIGH (ref 3.8–4.8)
Alkaline Phosphatase: 90 IU/L (ref 39–117)
BUN/Creatinine Ratio: 15 (ref 9–23)
BUN: 11 mg/dL (ref 6–24)
Bilirubin Total: 0.4 mg/dL (ref 0.0–1.2)
CO2: 23 mmol/L (ref 20–29)
Calcium: 10.1 mg/dL (ref 8.7–10.2)
Chloride: 94 mmol/L — ABNORMAL LOW (ref 96–106)
Creatinine, Ser: 0.72 mg/dL (ref 0.57–1.00)
GFR calc Af Amer: 113 mL/min/{1.73_m2} (ref >59)
GFR calc non Af Amer: 98 mL/min/{1.73_m2} (ref >59)
Globulin, Total: 2.2 g/dL (ref 1.5–4.5)
Glucose: 78 mg/dL (ref 65–99)
Potassium: 4.5 mmol/L (ref 3.5–5.2)
Sodium: 139 mmol/L (ref 134–144)
Total Protein: 7.6 g/dL (ref 6.0–8.5)

## 2018-12-29 LAB — THYROID PEROXIDASE ANTIBODY: Thyroperoxidase Ab SerPl-aCnc: 9 IU/mL (ref 0–34)

## 2018-12-29 LAB — ANA W/REFLEX: Anti Nuclear Antibody (ANA): POSITIVE — AB

## 2018-12-29 LAB — HELICOBACTER PYLORI ABS-IGG+IGA, BLD
H. pylori, IgA Abs: <9 units (ref 0.0–8.9)
H. pylori, IgG AbS: 0.46 Index Value (ref 0.00–0.79)

## 2018-12-29 LAB — CHRONIC URTICARIA: cu index: 4 (ref <10)

## 2018-12-29 MED ORDER — EPINEPHRINE 0.3 MG/0.3ML IJ SOAJ: 0.3000 mg | INTRAMUSCULAR | 2 refills | Status: AC | PRN

## 2018-12-29 NOTE — Telephone Encounter (Signed)
Madeline Reid sent in per Dr Bobbitt's request for +/- to alpha gal.

## 2018-12-31 ENCOUNTER — Other Ambulatory Visit: Payer: Self-pay

## 2018-12-31 ENCOUNTER — Encounter: Payer: Self-pay | Admitting: Internal Medicine

## 2018-12-31 ENCOUNTER — Ambulatory Visit: Payer: Managed Care, Other (non HMO) | Admitting: Internal Medicine

## 2018-12-31 VITALS — BP 130/88 | HR 94 | Temp 98.0°F | Ht 65.0 in | Wt 147.0 lb

## 2018-12-31 DIAGNOSIS — K760 Fatty (change of) liver, not elsewhere classified: Secondary | ICD-10-CM | POA: Diagnosis not present

## 2018-12-31 DIAGNOSIS — G8929 Other chronic pain: Secondary | ICD-10-CM | POA: Diagnosis not present

## 2018-12-31 DIAGNOSIS — M25511 Pain in right shoulder: Secondary | ICD-10-CM | POA: Diagnosis not present

## 2018-12-31 DIAGNOSIS — M25519 Pain in unspecified shoulder: Secondary | ICD-10-CM | POA: Insufficient documentation

## 2018-12-31 NOTE — Assessment & Plan Note (Signed)
With elevated sed rate If still up, will do trial with prednisone for possible PMR

## 2018-12-31 NOTE — Assessment & Plan Note (Signed)
LFTs elevated again She thinks it may be due to MCT oil Will recheck and do ultrasound also

## 2018-12-31 NOTE — Progress Notes (Signed)
Subjective:    Patient ID: Madeline Reid, female    DOB: December 27, 1970, 48 y.o.   MRN: [id number]  HPI Here for follow up due to increased liver function tests Had extensive blood work  Liver tests were up Had taken some MCT oil before the last blood work Continues on milk thistle---started it fairly recently Taking tumeric for shoulder pain  She is concerned about ongoing shoulder pain Tender Husband thinks the right one is different  Current Outpatient Medications on File Prior to Visit  Medication Sig Dispense Refill  . ALPRAZolam (XANAX) 0.25 MG tablet Take 1 tablet (0.25 mg total) by mouth 2 (two) times daily as needed for anxiety. 20 tablet 0  . Black Elderberry (SAMBUCUS ELDERBERRY PO) Take by mouth. 500 mg zinc + vitamin c    . co-enzyme Q-10 30 MG capsule Take 100 mg by mouth daily.    Marland Kitchen EPINEPHrine (AUVI-Q) 0.3 mg/0.3 mL IJ SOAJ injection Inject 0.3 mLs (0.3 mg total) into the muscle as needed. 2 each 2  . famotidine (PEPCID) 20 MG tablet Take 1 tablet (20 mg total) by mouth 2 (two) times daily. 60 tablet 5  . fluticasone (FLONASE) 50 MCG/ACT nasal spray Place 2 sprays into both nostrils daily as needed for allergies or rhinitis. 16 g 5  . KRILL OIL PO Take by mouth.    . levocetirizine (XYZAL) 5 MG tablet Take 1 tablet (5 mg total) by mouth daily as needed for allergies. 30 tablet 5  . losartan (COZAAR) 25 MG tablet TAKE 1 TABLET BY MOUTH  DAILY 90 tablet 1  . MILK THISTLE PO Take 7,500 mg by mouth daily. 80% silymarin flavonoids    . Turmeric 500 MG CAPS Take 1,000 mg by mouth daily.    . [DISCONTINUED] omeprazole (PRILOSEC) 20 MG capsule Take 20 mg by mouth daily.       No current facility-administered medications on file prior to visit.     Allergies  Allergen Reactions  . Lisinopril     Potential cough    Past Medical History:  Diagnosis Date  . Atopic dermatitis versus contact dermatitis 12/20/2018  . Endometriosis   . GERD (gastroesophageal reflux  disease)   . Hypertension   . Urticaria     Past Surgical History:  Procedure Laterality Date  . LAPAROSCOPY  1992   endometriosis  . MYOMECTOMY  2007   removed    Family History  Problem Relation Age of Onset  . Eczema Mother   . Cancer Paternal Grandfather        throat cancer  . Coronary artery disease Maternal Grandmother   . Coronary artery disease Paternal Grandmother   . Eczema Maternal Grandfather   . Colon cancer Neg Hx   . Breast cancer Neg Hx     Social History   Socioeconomic History  . Marital status: Married    Spouse name: Not on file  . Number of children: 2  . Years of education: Not on file  . Highest education level: Not on file  Occupational History  . Occupation: Engineer, agricultural: [employer]  Social Needs  . Financial resource strain: Not on file  . Food insecurity    Worry: Not on file    Inability: Not on file  . Transportation needs    Medical: Not on file    Non-medical: Not on file  Tobacco Use  . Smoking status: Former Smoker    Types: Cigarettes  Quit date: 08/28/2006    Years since quitting: 12.3  . Smokeless tobacco: Never Used  Substance and Sexual Activity  . Alcohol use: Yes    Comment: occ  . Drug use: No  . Sexual activity: Not on file  Lifestyle  . Physical activity    Days per week: Not on file    Minutes per session: Not on file  . Stress: Not on file  Relationships  . Social Herbalist on phone: Not on file    Gets together: Not on file    Attends religious service: Not on file    Active member of club or organization: Not on file    Attends meetings of clubs or organizations: Not on file    Relationship status: Not on file  . Intimate partner violence    Fear of current or ex partner: Not on file    Emotionally abused: Not on file    Physically abused: Not on file    Forced sexual activity: Not on file  Other Topics Concern  . Not on file  Social History Narrative   Adopted 2  children in 2012            Review of Systems  No fever Sleep is variable Appetite is good Weight is down a bit with diet     Objective:   Physical Exam  Constitutional: She appears well-developed. No distress.  Musculoskeletal:     Comments: Normal ROM of right shoulder No real tenderness           Assessment & Plan:

## 2019-01-01 LAB — HEPATIC FUNCTION PANEL
ALT: 169 IU/L — ABNORMAL HIGH (ref 0–32)
AST: 73 IU/L — ABNORMAL HIGH (ref 0–40)
Albumin: 4.7 g/dL (ref 3.8–4.8)
Alkaline Phosphatase: 76 IU/L (ref 39–117)
Bilirubin Total: 0.3 mg/dL (ref 0.0–1.2)
Bilirubin, Direct: 0.09 mg/dL (ref 0.00–0.40)
Total Protein: 7.2 g/dL (ref 6.0–8.5)

## 2019-01-01 LAB — SEDIMENTATION RATE: Sed Rate: 34 mm/hr (ref 0–40)

## 2019-01-10 ENCOUNTER — Other Ambulatory Visit: Payer: Self-pay | Admitting: Internal Medicine

## 2019-01-11 ENCOUNTER — Ambulatory Visit
Admission: RE | Admit: 2019-01-11 | Discharge: 2019-01-11 | Disposition: A | Discharge status: Home / Self Care | Payer: Managed Care, Other (non HMO) | Source: Ambulatory Visit | Attending: Internal Medicine | Admitting: Internal Medicine

## 2019-01-11 DIAGNOSIS — K760 Fatty (change of) liver, not elsewhere classified: Secondary | ICD-10-CM

## 2019-01-12 DIAGNOSIS — R7989 Other specified abnormal findings of blood chemistry: Secondary | ICD-10-CM

## 2019-01-13 NOTE — Telephone Encounter (Signed)
I left a message on patient's voice mail to call back and schedule 3 month lab.

## 2019-01-13 NOTE — Telephone Encounter (Signed)
Please set up a lab appt for her in about 3 months

## 2019-01-26 ENCOUNTER — Other Ambulatory Visit: Payer: Self-pay | Admitting: Internal Medicine

## 2019-01-26 NOTE — Telephone Encounter (Signed)
Last filled 11-23-18 #20 Last OV 12-31-18 Next OV 11-28-19 [facility]  Forwarding to [facility] in Dr Alla German absence

## 2019-04-13 ENCOUNTER — Other Ambulatory Visit: Payer: Self-pay | Admitting: Internal Medicine

## 2019-04-14 NOTE — Telephone Encounter (Signed)
Last filled 01-26-19 #20 Last OV 12-31-18 Next OV 11-28-19 [facility]

## 2019-05-16 ENCOUNTER — Other Ambulatory Visit: Payer: Self-pay | Admitting: Internal Medicine

## 2019-05-16 NOTE — Telephone Encounter (Signed)
Last filled 04-14-19 #20 Last OV 12-31-18 Next OV 11-28-19 [facility]

## 2019-07-20 ENCOUNTER — Other Ambulatory Visit: Payer: Self-pay | Admitting: Internal Medicine

## 2019-07-20 NOTE — Telephone Encounter (Signed)
Last filled 05-16-19 #20 Last OV 12-31-18 Next OV 11-28-19 [facility]

## 2019-08-10 ENCOUNTER — Encounter: Payer: Self-pay | Admitting: Internal Medicine

## 2019-09-13 ENCOUNTER — Ambulatory Visit: Payer: Managed Care, Other (non HMO) | Admitting: Dermatology

## 2019-09-14 ENCOUNTER — Other Ambulatory Visit: Payer: Self-pay | Admitting: Internal Medicine

## 2019-09-14 NOTE — Telephone Encounter (Signed)
Last filled 07-20-19 #20 Last OV 12-31-18 Next OV 11-29-19 [facility]

## 2019-09-26 ENCOUNTER — Ambulatory Visit: Payer: Managed Care, Other (non HMO) | Admitting: Dermatology

## 2019-09-26 ENCOUNTER — Other Ambulatory Visit: Payer: Self-pay

## 2019-09-26 DIAGNOSIS — L821 Other seborrheic keratosis: Secondary | ICD-10-CM

## 2019-09-26 DIAGNOSIS — D229 Melanocytic nevi, unspecified: Secondary | ICD-10-CM | POA: Diagnosis not present

## 2019-09-26 DIAGNOSIS — L814 Other melanin hyperpigmentation: Secondary | ICD-10-CM

## 2019-09-26 DIAGNOSIS — R21 Rash and other nonspecific skin eruption: Secondary | ICD-10-CM

## 2019-09-26 DIAGNOSIS — D1800 Hemangioma unspecified site: Secondary | ICD-10-CM

## 2019-09-26 DIAGNOSIS — L578 Other skin changes due to chronic exposure to nonionizing radiation: Secondary | ICD-10-CM

## 2019-09-26 DIAGNOSIS — L738 Other specified follicular disorders: Secondary | ICD-10-CM | POA: Diagnosis not present

## 2019-09-26 DIAGNOSIS — Z1283 Encounter for screening for malignant neoplasm of skin: Secondary | ICD-10-CM

## 2019-09-26 MED ORDER — KETOCONAZOLE 2 % EX SHAM: MEDICATED_SHAMPOO | CUTANEOUS | 3 refills | Status: DC

## 2019-09-26 MED ORDER — CLINDAMYCIN PHOSPHATE 1 % EX LOTN: TOPICAL_LOTION | CUTANEOUS | 3 refills | Status: DC

## 2019-09-26 NOTE — Progress Notes (Signed)
   New Patient Visit  Subjective  Madeline Reid is a 49 y.o. female who presents for the following: Annual Exam (fhx history of skin issues - no personal hx of skin CA or dysplastic nevi). The patient presents for Total-Body Skin Exam (TBSE) for skin cancer screening and mole check.  The following portions of the chart were reviewed this encounter and updated as appropriate:  Tobacco  Allergies  Meds  Problems  Med Hx  Surg Hx  Fam Hx     Review of Systems:  No other skin or systemic complaints except as noted in HPI or Assessment and Plan.  Objective  Well appearing patient in no apparent distress; mood and affect are within normal limits.  A full examination was performed including scalp, head, eyes, ears, nose, lips, neck, chest, axillae, abdomen, back, buttocks, bilateral upper extremities, bilateral lower extremities, hands, feet, fingers, toes, fingernails, and toenails. All findings within normal limits unless otherwise noted below.  Objective  Face: Small yellow papules with a central dell.   Assessment & Plan  Rash and other nonspecific skin eruption Back  Miliaria rubra vs bacterial folliculitis vs pityrosporum folliculitis-  Start Clindamycin 1% lotion to aa back QD and  Ketoconazole 2% shampoo QOD. Let sit 5-10 minutes before washing off.   clindamycin (CLEOCIN-T) 1 % lotion - Back  ketoconazole (NIZORAL) 2 % shampoo - Back  Sebaceous hyperplasia Face Benign appearing, observe.  Discussed cost of $60 to treat the first one and $15 for each there after for eletrodessication cosmetic treatment.  Destruction of lesion - Face Complexity: simple   Destruction method comment:  Electrodessication Informed consent: discussed and consent obtained   Timeout:  patient name, date of birth, surgical site, and procedure verified Procedure prep:  Patient was prepped and draped in usual sterile fashion Prep type:  Isopropyl alcohol Hemostasis achieved with:   electrodesiccation Outcome: patient tolerated procedure well with no complications   Additional details:  R cheek x 4, L glabella x 1   Lentigines - Scattered tan macules - Discussed due to sun exposure - Benign, observe - Call for any changes  Seborrheic Keratoses - Stuck-on, waxy, tan-brown papules and plaques  - Discussed benign etiology and prognosis. - Observe - Call for any changes  Melanocytic Nevi - Tan-brown and/or pink-flesh-colored symmetric macules and papules - Benign appearing on exam today - Observation - Call clinic for new or changing moles - Recommend daily use of broad spectrum spf 30+ sunscreen to sun-exposed areas.   Hemangiomas - Red papules - Discussed benign nature - Observe - Call for any changes  Actinic Damage - diffuse scaly erythematous macules with underlying dyspigmentation - Recommend daily broad spectrum sunscreen SPF 30+ to sun-exposed areas, reapply every 2 hours as needed.  - Call for new or changing lesions.  Skin cancer screening performed today.  Return in about 1 year (around 09/25/2020) for TBSE.  Luther Redo, CMA, am acting as scribe for Sarina Ser, MD .  Documentation: I have reviewed the above documentation for accuracy and completeness, and I agree with the above.  Sarina Ser, MD

## 2019-09-26 NOTE — Patient Instructions (Signed)

## 2019-10-01 ENCOUNTER — Encounter: Payer: Self-pay | Admitting: Dermatology

## 2019-11-28 ENCOUNTER — Encounter: Payer: Managed Care, Other (non HMO) | Admitting: Internal Medicine

## 2019-11-29 ENCOUNTER — Encounter: Payer: Self-pay | Admitting: Internal Medicine

## 2019-11-29 ENCOUNTER — Ambulatory Visit (INDEPENDENT_AMBULATORY_CARE_PROVIDER_SITE_OTHER): Payer: Managed Care, Other (non HMO) | Admitting: Internal Medicine

## 2019-11-29 ENCOUNTER — Other Ambulatory Visit: Payer: Self-pay

## 2019-11-29 VITALS — BP 112/70 | HR 92 | Temp 97.8°F | Ht 65.0 in | Wt 123.0 lb

## 2019-11-29 DIAGNOSIS — Z0000 Encounter for general adult medical examination without abnormal findings: Secondary | ICD-10-CM

## 2019-11-29 DIAGNOSIS — K760 Fatty (change of) liver, not elsewhere classified: Secondary | ICD-10-CM | POA: Diagnosis not present

## 2019-11-29 DIAGNOSIS — I10 Essential (primary) hypertension: Secondary | ICD-10-CM | POA: Diagnosis not present

## 2019-11-29 DIAGNOSIS — N951 Menopausal and female climacteric states: Secondary | ICD-10-CM | POA: Insufficient documentation

## 2019-11-29 DIAGNOSIS — R200 Anesthesia of skin: Secondary | ICD-10-CM

## 2019-11-29 DIAGNOSIS — Z23 Encounter for immunization: Secondary | ICD-10-CM

## 2019-11-29 MED ORDER — MEDROXYPROGESTERONE ACETATE 2.5 MG PO TABS: 2.5000 mg | ORAL_TABLET | Freq: Every day | ORAL | 3 refills | Status: DC

## 2019-11-29 MED ORDER — ESTRADIOL 1 MG PO TABS: 1.0000 mg | ORAL_TABLET | Freq: Every day | ORAL | 3 refills | Status: DC

## 2019-11-29 NOTE — Assessment & Plan Note (Signed)
BP Readings from Last 3 Encounters:  11/29/19 112/70  12/31/18 130/88  12/20/18 124/78   Good control now Could consider stopping if stays low

## 2019-11-29 NOTE — Assessment & Plan Note (Signed)
Has been troublesome and keeping her from sleeping Will try combined hormone Rx

## 2019-11-29 NOTE — Progress Notes (Signed)
Subjective:    Patient ID: Madeline Reid, female    DOB: 10/09/1970, 48 y.o.   MRN: [id number]  HPI Here for physical This visit occurred during the SARS-CoV-2 public health emergency.  Safety protocols were in place, including screening questions prior to the visit, additional usage of staff PPE, and extensive cleaning of exam room while observing appropriate contact time as indicated for disinfecting solutions.   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 ALPRAZolam (XANAX) 0.25 MG tablet TAKE 1 TABLET BY MOUTH TWICE A DAY AS NEEDED FOR ANXIETY 20 tablet 0  . Black Elderberry (SAMBUCUS ELDERBERRY PO) Take by mouth. 500 mg zinc + vitamin c    . clindamycin (CLEOCIN-T) 1 % lotion Apply to back QD 60 mL 3  . EPINEPHrine (AUVI-Q) 0.3 mg/0.3 mL IJ SOAJ injection Inject 0.3 mLs (0.3 mg total) into the muscle as needed. 2 each 2  . KRILL OIL PO Take by mouth.    . losartan (COZAAR) 25 MG tablet TAKE 1 TABLET BY MOUTH  DAILY 90 tablet 3  . MILK THISTLE PO Take 7,500 mg by mouth daily. 80% silymarin flavonoids    . Turmeric 500 MG CAPS Take 1,000 mg by mouth daily.    . [DISCONTINUED] omeprazole (PRILOSEC) 20 MG capsule Take 20 mg by mouth daily.       No current facility-administered medications on file prior to visit.    Allergies  Allergen Reactions  . Lisinopril     Potential cough    Past Medical History:  Diagnosis Date  . Atopic dermatitis versus contact dermatitis 12/20/2018  . Endometriosis   . GERD (gastroesophageal reflux disease)   . Hypertension   . Urticaria     Past Surgical History:  Procedure Laterality Date  . LAPAROSCOPY  1992   endometriosis  . MYOMECTOMY   2007   removed    Family History  Problem Relation Age of Onset  . Eczema Mother   . Cancer Paternal Grandfather        throat cancer  . Coronary artery disease Maternal Grandmother   . Coronary artery disease Paternal Grandmother   . Eczema Maternal Grandfather   . Colon cancer Neg Hx   . Breast cancer Neg Hx     Social History   Socioeconomic History  . Marital status: Married    Spouse name: Not on file  . Number of children: 2  . Years of education: Not on file  . Highest education level: Not on file  Occupational History  . Occupation: Engineer, agricultural: [employer]  Tobacco Use  . Smoking status: Former Smoker    Types: Cigarettes    Quit date: 08/28/2006    Years since quitting: 13.2  . Smokeless tobacco: Never Used  Vaping Use  . Vaping Use: Never used  Substance and Sexual Activity  . Alcohol use: Yes    Comment: occ  . Drug use: No  . Sexual activity: Not on file  Other Topics Concern  . Not on file  Social History Narrative   Adopted 2 children in 2012            Social Determinants of Health   Financial  Resource Strain:   . Difficulty of Paying Living Expenses: Not on file  Food Insecurity:   . Worried About Charity fundraiser in the Last Year: Not on file  . Ran Out of Food in the Last Year: Not on file  Transportation Needs:   . Lack of Transportation (Medical): Not on file  . Lack of Transportation (Non-Medical): Not on file  Physical Activity:   . Days of Exercise per Week: Not on file  . Minutes of Exercise per Session: Not on file  Stress:   . Feeling of Stress : Not on file  Social Connections:   . Frequency of Communication with Friends and Family: Not on file  . Frequency of Social Gatherings with Friends and Family: Not on file  . Attends Religious Services: Not on file  . Active Member of Clubs or Organizations: Not on file  . Attends Archivist Meetings: Not on file  . Marital Status: Not on file  Intimate  Partner Violence:   . Fear of Current or Ex-Partner: Not on file  . Emotionally Abused: Not on file  . Physically Abused: Not on file  . Sexually Abused: Not on file   Review of Systems  Constitutional: Positive for unexpected weight change. Negative for fatigue.       Exercising some Wears seat belt  HENT: Negative for dental problem, hearing loss and tinnitus.        Keeps up with dentist  Eyes: Negative for visual disturbance.       No diplopia or unilateral vision loss  Respiratory: Negative for cough, chest tightness and shortness of breath.   Cardiovascular: Negative for chest pain, palpitations and leg swelling.  Gastrointestinal: Negative for blood in stool and constipation.       No heartburn   Endocrine: Negative for polydipsia and polyuria.  Genitourinary: Negative for dyspareunia, dysuria and hematuria.       Paps done at gyn Rare periods now  Musculoskeletal: Negative for arthralgias, back pain and joint swelling.  Skin: Negative for rash.  Allergic/Immunologic: Positive for environmental allergies. Negative for immunocompromised state.       Mild only--no meds recently  Neurological: Positive for dizziness. Negative for syncope, light-headedness and headaches.       Some heavy feeling in legs  Hematological: Negative for adenopathy. Does not bruise/bleed easily.  Psychiatric/Behavioral: Negative for dysphoric mood. The patient is not nervous/anxious.        Chronic sleep problems---hot and cold for 3 months        Objective:   Physical Exam Constitutional:      Appearance: Normal appearance.  HENT:     Right Ear: Tympanic membrane, ear canal and external ear normal.     Left Ear: Tympanic membrane, ear canal and external ear normal.     Mouth/Throat:     Pharynx: No oropharyngeal exudate or posterior oropharyngeal erythema.  Eyes:     Conjunctiva/sclera: Conjunctivae normal.     Pupils: Pupils are equal, round, and reactive to light.  Cardiovascular:      Rate and Rhythm: Normal rate and regular rhythm.     Pulses: Normal pulses.     Heart sounds: No murmur heard.  No gallop.   Pulmonary:     Effort: Pulmonary effort is normal.     Breath sounds: Normal breath sounds. No wheezing or rales.  Abdominal:     Palpations: Abdomen is soft.     Tenderness: There is no abdominal tenderness.  Musculoskeletal:     Cervical back: Neck supple.     Right lower leg: No edema.     Left lower leg: No edema.  Lymphadenopathy:     Cervical: No cervical adenopathy.  Skin:    General: Skin is warm.     Findings: No rash.  Neurological:     General: No focal deficit present.     Mental Status: She is alert and oriented to person, place, and time.  Psychiatric:        Mood and Affect: Mood normal.        Behavior: Behavior normal.            Assessment & Plan:

## 2019-11-29 NOTE — Assessment & Plan Note (Signed)
Hopefully better with weight loss

## 2019-11-29 NOTE — Assessment & Plan Note (Signed)
Healthy Has lost weight Will set up colon again Recent mammogram Pap at gyn Discussed exercise COVID booster a good idea

## 2019-11-29 NOTE — Assessment & Plan Note (Addendum)
?  nutritional Will check B12 Consider neurology evaluation

## 2019-11-30 DIAGNOSIS — R200 Anesthesia of skin: Secondary | ICD-10-CM

## 2019-11-30 LAB — LIPID PANEL
Chol/HDL Ratio: 2.3 ratio (ref 0.0–4.4)
Cholesterol, Total: 222 mg/dL — ABNORMAL HIGH (ref 100–199)
HDL: 98 mg/dL (ref >39)
LDL Chol Calc (NIH): 106 mg/dL — ABNORMAL HIGH (ref 0–99)
Triglycerides: 108 mg/dL (ref 0–149)
VLDL Cholesterol Cal: 18 mg/dL (ref 5–40)

## 2019-11-30 LAB — COMPREHENSIVE METABOLIC PANEL
ALT: 57 IU/L — ABNORMAL HIGH (ref 0–32)
AST: 32 IU/L (ref 0–40)
Albumin/Globulin Ratio: 2 (ref 1.2–2.2)
Albumin: 4.7 g/dL (ref 3.8–4.9)
Alkaline Phosphatase: 64 IU/L (ref 44–121)
BUN/Creatinine Ratio: 21 (ref 9–23)
BUN: 12 mg/dL (ref 6–24)
Bilirubin Total: 0.5 mg/dL (ref 0.0–1.2)
CO2: 25 mmol/L (ref 20–29)
Calcium: 9.7 mg/dL (ref 8.7–10.2)
Chloride: 102 mmol/L (ref 96–106)
Creatinine, Ser: 0.58 mg/dL (ref 0.57–1.00)
GFR calc Af Amer: 123 mL/min/{1.73_m2} (ref >59)
GFR calc non Af Amer: 107 mL/min/{1.73_m2} (ref >59)
Globulin, Total: 2.4 g/dL (ref 1.5–4.5)
Glucose: 89 mg/dL (ref 65–99)
Potassium: 4.2 mmol/L (ref 3.5–5.2)
Sodium: 142 mmol/L (ref 134–144)
Total Protein: 7.1 g/dL (ref 6.0–8.5)

## 2019-11-30 LAB — CBC
Hematocrit: 39.4 % (ref 34.0–46.6)
Hemoglobin: 13.5 g/dL (ref 11.1–15.9)
MCH: 34 pg — ABNORMAL HIGH (ref 26.6–33.0)
MCHC: 34.3 g/dL (ref 31.5–35.7)
MCV: 99 fL — ABNORMAL HIGH (ref 79–97)
Platelets: 247 10*3/uL (ref 150–450)
RBC: 3.97 x10E6/uL (ref 3.77–5.28)
RDW: 12 % (ref 11.7–15.4)
WBC: 4.5 10*3/uL (ref 3.4–10.8)

## 2019-11-30 LAB — T4, FREE: Free T4: 1.06 ng/dL (ref 0.82–1.77)

## 2019-11-30 LAB — VITAMIN B12: Vitamin B-12: 859 pg/mL (ref 232–1245)

## 2019-12-08 ENCOUNTER — Other Ambulatory Visit: Payer: Self-pay | Admitting: Internal Medicine

## 2019-12-27 NOTE — Telephone Encounter (Signed)
Madeline Reid, I put in this referral a while ago but she is still waiting to hear back. I put it in again in case it got lost.

## 2019-12-29 NOTE — Telephone Encounter (Signed)
Referral is being followed - this looks to be  duplicate referral - [facility] has not processed it yet. We need to call and get patient scheduled with them.   See referral notes in the W[facility]

## 2020-02-02 ENCOUNTER — Other Ambulatory Visit: Payer: Self-pay | Admitting: Internal Medicine

## 2020-02-10 ENCOUNTER — Telehealth: Payer: Managed Care, Other (non HMO) | Admitting: Family

## 2020-02-10 DIAGNOSIS — Z20822 Contact with and (suspected) exposure to covid-19: Secondary | ICD-10-CM

## 2020-02-10 MED ORDER — PROMETHAZINE-DM 6.25-15 MG/5ML PO SYRP: 5.0000 mL | ORAL_SOLUTION | Freq: Four times a day (QID) | ORAL | 0 refills | Status: DC | PRN

## 2020-02-10 NOTE — Progress Notes (Signed)
E-Visit for Corona Virus Screening  Your current symptoms could be consistent with the coronavirus.  Many health care providers can now test patients at their office but not all are.  [place] has multiple testing sites. For information on our [place] testing locations and hours go to [email or web address]  We are enrolling you in our [place] for [place] . Daily you will receive a questionnaire within the [facility] website. Our COVID 19 response team will be monitoring your responses daily.  Testing Information: The COVID-19 Community Testing sites are testing BY APPOINTMENT ONLY.  You can schedule online at [email or web address]  If you do not have access to a smart phone or computer you may call [phone] for an appointment.   Additional testing sites in the Community:  . For [facility] Testing sites in [facility]  FaceUpdate.[email or web address]  . For Pop-up testing sites in [place]  BowlDirectory.co.uk  . For [facility] [email or web address]  . For [facility] testing in [place] and [employer] [email or web address]  . For [facility] testing in [facility]   [email or web address]  For  more information about community testing call [phone]   Please quarantine yourself while awaiting your test results. Please stay home for a minimum of 10 days from the first day of illness with improving symptoms and you have had 24 hours of no fever (without the use of Tylenol (Acetaminophen) Motrin (Ibuprofen) or any fever reducing medication).  Also - Do not get tested prior to returning to work because once you have had a positive test the test can stay  positive for more than a month in some cases.   You should wear a mask or cloth face covering over your nose and mouth if you must be around other people or animals, including pets (even at home). Try to stay at least 6 feet away from other people. This will protect the people around you.  Please continue good preventive care measures, including:  frequent hand-washing, avoid touching your face, cover coughs/sneezes, stay out of crowds and keep a 6 foot distance from others.  COVID-19 is a respiratory illness with symptoms that are similar to the flu. Symptoms are typically mild to moderate, but there have been cases of severe illness and death due to the virus.   The following symptoms may appear 2-14 days after exposure: . Fever . Cough . Shortness of breath or difficulty breathing . Chills . Repeated shaking with chills . Muscle pain . Headache . Sore throat . New loss of taste or smell . Fatigue . Congestion or runny nose . Nausea or vomiting . Diarrhea  Go to the nearest hospital ED for assessment if fever/cough/breathlessness are severe or illness seems like a threat to life.  It is vitally important that if you feel that you have an infection such as this virus or any other virus that you stay home and away from places where you may spread it to others.  You should avoid contact with people age 74 and older.   You can use medication such as A prescription cough medication called Phenergan DM 6.25 mg/15 mg. You make take one teaspoon / 5 ml every 4-6 hours as needed for cough  You may also take acetaminophen (Tylenol) as needed for fever.  Reduce your risk of any infection by using the same precautions used for avoiding the common cold or flu:  Marland Kitchen Wash your hands often with soap and warm water for at least 20 seconds.  If soap  and water are not readily available, use an alcohol-based hand sanitizer with at least 60% alcohol.  . If coughing or sneezing, cover your mouth and nose by  coughing or sneezing into the elbow areas of your shirt or coat, into a tissue or into your sleeve (not your hands). . Avoid shaking hands with others and consider head nods or verbal greetings only. . Avoid touching your eyes, nose, or mouth with unwashed hands.  . Avoid close contact with people who are sick. . Avoid places or events with large numbers of people in one location, like concerts or sporting events. . Carefully consider travel plans you have or are making. . If you are planning any travel outside or inside the [place], visit the CDC's Travelers' Health webpage for the latest health notices. . If you have some symptoms but not all symptoms, continue to monitor at home and seek medical attention if your symptoms worsen. . If you are having a medical emergency, call 911.  HOME CARE . Only take medications as instructed by your medical team. . Drink plenty of fluids and get plenty of rest. . A steam or ultrasonic humidifier can help if you have congestion.   GET HELP RIGHT AWAY IF YOU HAVE EMERGENCY WARNING SIGNS** FOR COVID-19. If you or someone is showing any of these signs seek emergency medical care immediately. Call 911 or proceed to your closest emergency facility if: . You develop worsening high fever. . Trouble breathing . Bluish lips or face . Persistent pain or pressure in the chest . New confusion . Inability to wake or stay awake . You cough up blood. . Your symptoms become more severe  **This list is not all possible symptoms. Contact your medical provider for any symptoms that are sever or concerning to you.  MAKE SURE YOU   Understand these instructions.  Will watch your condition.  Will get help right away if you are not doing well or get worse.  Your e-visit answers were reviewed by a board certified advanced clinical practitioner to complete your personal care plan.  Depending on the condition, your plan could have included both over the counter or  prescription medications.  If there is a problem please reply once you have received a response from your provider.  Your safety is important to [place].  If you have drug allergies check your prescription carefully.    You can use MyChart to ask questions about today's visit, request a non-urgent call back, or ask for a work or school excuse for 24 hours related to this e-Visit. If it has been greater than 24 hours you will need to follow up with your provider, or enter a new e-Visit to address those concerns. You will get an e-mail in the next two days asking about your experience.  I hope that your e-visit has been valuable and will speed your recovery. Thank you for using e-visits.   Greater than 5 minutes, yet less than 10 minutes of time have been spent researching, coordinating, and implementing care for this patient today.  Thank you for the details you included in the comment boxes. Those details are very helpful in determining the best course of treatment for you and help [place] to provide the best care.

## 2020-02-23 ENCOUNTER — Ambulatory Visit: Payer: Managed Care, Other (non HMO) | Admitting: Podiatry

## 2020-03-13 ENCOUNTER — Ambulatory Visit: Payer: Managed Care, Other (non HMO) | Admitting: Podiatry

## 2020-03-15 ENCOUNTER — Encounter: Payer: Self-pay | Admitting: Podiatry

## 2020-03-15 ENCOUNTER — Ambulatory Visit: Payer: Managed Care, Other (non HMO) | Admitting: Podiatry

## 2020-03-15 ENCOUNTER — Other Ambulatory Visit: Payer: Self-pay

## 2020-03-15 DIAGNOSIS — I7300 Raynaud's syndrome without gangrene: Secondary | ICD-10-CM

## 2020-03-15 DIAGNOSIS — G5760 Lesion of plantar nerve, unspecified lower limb: Secondary | ICD-10-CM

## 2020-03-16 ENCOUNTER — Encounter: Payer: Self-pay | Admitting: Podiatry

## 2020-03-16 NOTE — Progress Notes (Signed)
Subjective:  Patient ID: Madeline Reid, female    DOB: 05-May-1970,  MRN: [id number]  Chief Complaint  Patient presents with  . Foot Pain    Patient presents today for bilat forefeet pain, tenderness x 1 year.  She says her feet are numb feeling and it seems worse when taking showers.  She says her forfeet are "uncomfortable and feel swollen"    50 y.o. female presents with the above complaint.  Patient presents with 2 complaints with primary complaint of bilateral second interspace neuroma/pain with ambulation.  Patient states that she gets tingling feeling.  She has mild pain but is more of the weird feeling that she gets in the forefoot of her toes bilaterally.  They are both about equivocal.  She also has secondary complaint of discoloration to digits 1 through 5 bilaterally that could turn into bluish/reddish color changes.  She has a history of Raynaud's phenomenon.  She has not had any prior work-up for other than being diagnosed with Raynaud's.  She states that she has not had any proper blood work done as well.  She denies any other acute complaints.  She also has a history of rash in other parts of her body as well.  She would like to discuss treatment options she has not seen anyone else prior to seeing me.   Review of Systems: Negative except as noted in the HPI. Denies N/V/F/Ch.  Past Medical History:  Diagnosis Date  . Atopic dermatitis versus contact dermatitis 12/20/2018  . Endometriosis   . GERD (gastroesophageal reflux disease)   . Hypertension   . Urticaria     Current Outpatient Medications:  .  ALPRAZolam (XANAX) 0.25 MG tablet, TAKE 1 TABLET BY MOUTH TWICE A DAY AS NEEDED FOR ANXIETY, Disp: 20 tablet, Rfl: 0 .  Black Elderberry (SAMBUCUS ELDERBERRY PO), Take by mouth. 500 mg zinc + vitamin c, Disp: , Rfl:  .  EPINEPHrine (AUVI-Q) 0.3 mg/0.3 mL IJ SOAJ injection, Inject 0.3 mLs (0.3 mg total) into the muscle as needed., Disp: 2 each, Rfl: 2 .  estradiol (ESTRACE) 1  MG tablet, Take 1 tablet (1 mg total) by mouth daily., Disp: 90 tablet, Rfl: 3 .  KRILL OIL PO, Take by mouth., Disp: , Rfl:  .  losartan (COZAAR) 25 MG tablet, TAKE 1 TABLET BY MOUTH  DAILY, Disp: 90 tablet, Rfl: 3 .  medroxyPROGESTERone (PROVERA) 2.5 MG tablet, Take 1 tablet (2.5 mg total) by mouth daily., Disp: 90 tablet, Rfl: 3 .  MILK THISTLE PO, Take 7,500 mg by mouth daily. 80% silymarin flavonoids, Disp: , Rfl:  .  niacin 500 MG tablet, Take by mouth., Disp: , Rfl:  .  Turmeric 500 MG CAPS, Take 1,000 mg by mouth daily., Disp: , Rfl:   Social History   Tobacco Use  Smoking Status Former Smoker  . Types: Cigarettes  . Quit date: 08/28/2006  . Years since quitting: 13.5  Smokeless Tobacco Never Used    Allergies  Allergen Reactions  . Lisinopril     Potential cough   Objective:  There were no vitals filed for this visit. There is no height or weight on file to calculate BMI. Constitutional Well developed. Well nourished.  Vascular Dorsalis pedis pulses palpable bilaterally. Posterior tibial pulses palpable bilaterally. Capillary refill normal to all digits.  No cyanosis or clubbing noted. Pedal hair growth normal.  Neurologic Normal speech. Oriented to person, place, and time. Epicritic sensation to light touch grossly present bilaterally.  Dermatologic  positive Mulder's click noted in the second interspace bilaterally.  Pain on palpation to the lateral squeeze test.  Shooting tingling sensation noted.  Negative Sullivan sign bilaterally.  No pain with range of motion of the second and third metatarsophalangeal joint.  No extensor or flexor tendinitis.  Clinical visualization of the digits noted with mild rubor to all the tips of the toe.  No purplish discoloration at this time.  She does have subjective history of purplish discoloration.  Orthopedic: Normal joint ROM without pain or crepitus bilaterally. No visible deformities. No bony tenderness.   Radiographs:  None Assessment:   1. Raynaud's phenomenon without gangrene   2. 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  Raynaud's phenomenon without gangrene with history of back rash -I explained to the patient the etiology of Raynaud's phenomena and various treatment options were discussed.  Given that this has been going on for a long period of time and she has not had any proper work-up in 2 any autoimmune condition in the past I believe she will benefit from rheumatology follow-up given that she also has a history of rashes in other parts of her body as well.  Patient agrees with the plan.  Referral for rheumatology was placed.  Bilateral second interspace neuroma -I explained to the patient the etiology of neuroma and various treatment options were discussed.  I discussed with her that clinically there is a strong correlation that she does have neuromas however at this time she wants to hold off on any injections or any treatments for now.  She states that she will be happy to try shoe gear modification as well as padding.  Clinic if it continues to bother her or gets worse we will discuss treatment options including surgical excision.  Patient agrees with the plan.  No follow-ups on file.

## 2020-04-19 NOTE — Progress Notes (Signed)
Office Visit Note  Patient: Madeline Reid             Date of Birth: [birth year]           MRN: [id number]             PCP: Venia Carbon, MD Referring: Felipa Furnace, DPM Visit Date: 04/20/2020   Subjective:  New Patient (Initial Visit) (Patient complains of symptoms of Raynaud's that seem to have worsened lately. Patient complains of intermittent joint pain and stiffness in bilateral knees and bilateral hips. )   History of Present Illness: Madeline Reid is a 50 y.o. female with a history of urticaria, GERD, HTN here for evaluation of raynaud's phenomenon and positive ANA. She had a history of urticaria type skin rashes for several years but these have stopped since about 1.5 years ago. She has somewhat chronic joint pain that comes and goes, lately some more persistent and frequent pains in her hips and knees. These improve with taking turmeric, she has not found much benefit in other OTCs or prescription NSAIDs. She reports raynaud's symptoms since many years ago but more recently these are worse in frequency and duration. She had some episode of residual erythema and skin changes in her finger, but not since last year. She gets more patchy pallor in the feet involving multiple toes or sometimes patches of the plantar aspect of the foot, these were never associated with residual erythematous changes.   Labs reviewed 11/2018 CBC wnl CMP AST 133 ALT 195 ANA pos RNP 1.0 dsDNA 6  03/2015 ANA pos dsDNA 23 C3 C4 wnl  Activities of Daily Living:  Patient reports morning stiffness for 1-5 minutes.   Patient Denies nocturnal pain.  Difficulty dressing/grooming: Denies Difficulty climbing stairs: Reports Difficulty getting out of chair: Denies Difficulty using hands for taps, buttons, cutlery, and/or writing: Reports  Review of Systems  Constitutional: Negative for fatigue.  HENT: Negative for mouth sores, mouth dryness and nose dryness.   Eyes: Negative for pain,  itching, visual disturbance and dryness.  Respiratory: Negative for cough, hemoptysis, shortness of breath and difficulty breathing.   Cardiovascular: Negative for chest pain, palpitations and swelling in legs/feet.  Gastrointestinal: Negative for abdominal pain, blood in stool, constipation and diarrhea.  Endocrine: Negative for increased urination.  Genitourinary: Negative for painful urination.  Musculoskeletal: Positive for arthralgias, joint pain and morning stiffness. Negative for joint swelling, myalgias, muscle weakness, muscle tenderness and myalgias.  Skin: Positive for color change. Negative for rash and redness.  Allergic/Immunologic: Negative for susceptible to infections.  Neurological: Positive for numbness. Negative for dizziness, headaches, memory loss and weakness.  Hematological: Negative for swollen glands.  Psychiatric/Behavioral: Negative for confusion and sleep disturbance.    PMFS History:  Patient Active Problem List   Diagnosis Date Noted  . Menopausal syndrome 11/29/2019  . Sensory loss 11/29/2019  . Atopic dermatitis versus contact dermatitis 12/20/2018  . Perennial allergic rhinitis 12/20/2018  . Recurrent urticaria 11/23/2018  . Hepatic steatosis 01/13/2017  . ANA positive 05/14/2015  . Raynaud's phenomenon without gangrene 05/14/2015  . Actinic keratosis 08/05/2013  . Routine general medical examination at a health care facility 05/21/2010  . GERD 05/03/2009  . Essential hypertension, benign 12/03/2006    Past Medical History:  Diagnosis Date  . Atopic dermatitis versus contact dermatitis 12/20/2018  . Endometriosis   . GERD (gastroesophageal reflux disease)   . Hypertension   . Urticaria     Family History  Problem Relation  Age of Onset  . Eczema Mother   . Cancer Paternal Grandfather        throat cancer  . Coronary artery disease Maternal Grandmother   . Coronary artery disease Paternal Grandmother   . Eczema Maternal Grandfather   .  Colon cancer Neg Hx   . Breast cancer Neg Hx    Past Surgical History:  Procedure Laterality Date  . LAPAROSCOPY  1992   endometriosis  . MYOMECTOMY  2007   removed   Social History   Social History Narrative   Adopted 2 children in 2012            Immunization History  Administered Date(s) Administered  . Influenza, Seasonal, Injecte, Preservative Fre 11/28/2014  . Influenza,inj,Quad PF,6+ Mos 01/13/2017, 01/22/2018, 11/23/2018, 11/29/2019  . Influenza-Unspecified 10/27/2013  . PFIZER(Purple Top)SARS-COV-2 Vaccination 05/14/2019, 06/07/2019  . Td 06/15/2003  . Tdap 04/22/2013     Objective: Vital Signs: BP 121/79 (BP Location: Right Arm, Patient Position: Sitting, Cuff Size: Normal)   Pulse 81   Ht 5' 5"  (1.651 m)   Wt 122 lb (55.3 kg)   BMI 20.30 kg/m    Physical Exam HENT:     Right Ear: External ear normal.     Left Ear: External ear normal.     Mouth/Throat:     Mouth: Mucous membranes are moist.     Pharynx: Oropharynx is clear.  Eyes:     Conjunctiva/sclera: Conjunctivae normal.  Cardiovascular:     Rate and Rhythm: Normal rate and regular rhythm.  Pulmonary:     Effort: Pulmonary effort is normal.     Breath sounds: Normal breath sounds.  Skin:    General: Skin is warm and dry.     Findings: No rash.     Comments: Normal nailfold capillaroscopy No digital pitting, ridging, or telangiectasias Negative modified allen test  Neurological:     General: No focal deficit present.     Mental Status: She is alert.     Deep Tendon Reflexes: Reflexes normal.  Psychiatric:        Mood and Affect: Mood normal.     Musculoskeletal Exam:  Neck full ROM no tenderness Shoulders full ROM no tenderness or swelling Elbows full ROM no tenderness or swelling Wrists full ROM no tenderness or swelling Fingers full ROM no tenderness or swelling No paraspinal tenderness to palpation over upper and lower back Hip normal internal and external rotation without pain,  no tenderness to lateral hip palpation Knees full ROM no tenderness or swelling Ankles full ROM no tenderness or swelling MTPs full ROM no tenderness or swelling   Investigation: No additional findings.  Imaging: No results found.  Recent Labs: Lab Results  Component Value Date   WBC 4.5 11/29/2019   HGB 13.5 11/29/2019   PLT 247 11/29/2019   NA 142 11/29/2019   K 4.2 11/29/2019   CL 102 11/29/2019   CO2 25 11/29/2019   GLUCOSE 89 11/29/2019   BUN 12 11/29/2019   CREATININE 0.58 11/29/2019   BILITOT 0.5 11/29/2019   ALKPHOS 64 11/29/2019   AST 32 11/29/2019   ALT 57 (H) 11/29/2019   PROT 7.1 11/29/2019   ALBUMIN 4.7 11/29/2019   CALCIUM 9.7 11/29/2019   GFRAA 123 11/29/2019    Speciality Comments: No specialty comments available.  Procedures:  No procedures performed Allergies: Lisinopril   Assessment / Plan:     Visit Diagnoses: ANA positive - Plan: Beta-2 glycoprotein antibodies, Lupus Anticoagulant Eval w/Reflex, Anti-DNA  antibody, double-stranded, C3 and C4, Sedimentation rate, ANA, Lupus anticoagulant panel, Cardiolipin antibodies, IgG, IgM, IgA, Beta-2-glycoprotein i abs, IgG/M/A  Positive ANA with arthralgias and raynaud's symptoms but no objective inflammatory changes seen on exam and does not demonstrate clinical criteria of SLE. At this time I think less likely significant, will check more specific serology including dsDNA, complement, and ESR would observe if significantly abnormal.  Recurrent urticaria   Her previous rashes sound consistent with a chronic urticaria this can be associated with positive ANA but rarely a systemic connective tissue disease without vasculitis involvement. Also these seem to be improved in the past year without new treatment.  Raynaud's phenomenon without gangrene  Hand symptoms look typical for raynaud's, feet are slightly more patchy and less uniformly distal involvement but probably as well. No complications of ischemia  seen. Checking APS Abs.  Orders: Orders Placed This Encounter  Procedures  . Beta-2 glycoprotein antibodies  . Lupus Anticoagulant Eval w/Reflex  . Anti-DNA antibody, double-stranded  . C3 and C4  . Sedimentation rate  . ANA  . Lupus anticoagulant panel  . Cardiolipin antibodies, IgG, IgM, IgA  . Beta-2-glycoprotein i abs, IgG/M/A   No orders of the defined types were placed in this encounter.   Follow-Up Instructions: Return if symptoms worsen or fail to improve.   Collier Salina, MD  Note - This record has been created using [employer].  Chart creation errors have been sought, but may not always  have been located. Such creation errors do not reflect on  the standard of medical care.

## 2020-04-20 ENCOUNTER — Encounter: Payer: Self-pay | Admitting: Internal Medicine

## 2020-04-20 ENCOUNTER — Other Ambulatory Visit: Payer: Self-pay

## 2020-04-20 ENCOUNTER — Ambulatory Visit: Payer: Managed Care, Other (non HMO) | Admitting: Internal Medicine

## 2020-04-20 VITALS — BP 121/79 | HR 81 | Ht 65.0 in | Wt 122.0 lb

## 2020-04-20 DIAGNOSIS — R768 Other specified abnormal immunological findings in serum: Secondary | ICD-10-CM

## 2020-04-20 DIAGNOSIS — L500 Allergic urticaria: Secondary | ICD-10-CM

## 2020-04-20 DIAGNOSIS — I7300 Raynaud's syndrome without gangrene: Secondary | ICD-10-CM

## 2020-04-20 NOTE — Patient Instructions (Addendum)
I do not see any particular evidence of a systemic problem that is underlying your symptoms.  I am checking a few tests with markers for inflammation, complement test that can be low from conditions like lupus or some kinds of chronic urticaria, antiphospholipid antibodies that would be associated with a vascular or clotting problem, and rechecking ANA titer to see if this has increased from the pretty low level checked in 2020 and 2017.   Raynaud Phenomenon  Raynaud phenomenon is a condition that affects the blood vessels (arteries) that carry blood to your fingers and toes. The arteries that supply blood to your ears, lips, nipples, or the tip of your nose might also be affected. Raynaud phenomenon causes the arteries to become narrow temporarily (spasm). As a result, the flow of blood to the affected areas is temporarily decreased. This usually occurs in response to cold temperatures or stress. During an attack, the skin in the affected areas turns white, then blue, and finally red. You may also feel tingling or numbness in those areas. Attacks usually last for only a brief period, and then the blood flow to the area returns to normal. In most cases, Raynaud phenomenon does not cause serious health problems. What are the causes? In many cases, the cause of this condition is not known. The condition may occur on its own (primary Raynaud phenomenon) or may be associated with other diseases or factors (secondary Raynaud phenomenon). Possible causes may include:  Diseases or medical conditions that damage the arteries.  Injuries and repetitive actions that hurt the hands or feet.  Being exposed to certain chemicals.  Taking medicines that narrow the arteries.  Other medical conditions, such as lupus, scleroderma, rheumatoid arthritis, thyroid problems, blood disorders, Sjogren syndrome, or atherosclerosis. What increases the risk? The following factors may make you more likely to develop this  condition:  Being 26-80 years old.  Being female.  Having a family history of Raynaud phenomenon.  Living in a cold climate.  Smoking. What are the signs or symptoms? Symptoms of this condition usually occur when you are exposed to cold temperatures or when you have emotional stress. The symptoms may last for a few minutes or up to several hours. They usually affect your fingers but may also affect your toes, nipples, lips, ears, or the tip of your nose. Symptoms may include:  Changes in skin color. The skin in the affected areas will turn pale or white. The skin may then change from white to bluish to red as normal blood flow returns to the area.  Numbness, tingling, or pain in the affected areas. In severe cases, symptoms may include:  Skin sores.  Tissues decaying and dying (gangrene). How is this diagnosed? This condition may be diagnosed based on:  Your symptoms and medical history.  A physical exam. During the exam, you may be asked to put your hands in cold water to check for a reaction to cold temperature.  Tests, such as: ? Blood tests to check for other diseases or conditions. ? A test to check the movement of blood through your arteries and veins (vascular ultrasound). ? A test in which the skin at the base of your fingernail is examined under a microscope (nailfold capillaroscopy). How is this treated? Treatment for this condition often involves making lifestyle changes and taking steps to control your exposure to cold temperatures. For more severe cases, medicine (calcium channel blockers) may be used to improve blood flow. Surgery is sometimes done to block the  nerves that control the affected arteries, but this is rare. Follow these instructions at home: Avoiding cold temperatures Take these steps to avoid exposure to cold:  If possible, stay indoors during cold weather.  When you go outside during cold weather, dress in layers and wear mittens, a hat, a scarf,  and warm footwear.  Wear mittens or gloves when handling ice or frozen food.  Use holders for glasses or cans containing cold drinks.  Let warm water run for a while before taking a shower or bath.  Warm up the car before driving in cold weather. Lifestyle  If possible, avoid stressful and emotional situations. Try to find ways to manage your stress, such as: ? Exercise. ? Yoga. ? Meditation. ? Biofeedback.  Do not use any products that contain nicotine or tobacco, such as cigarettes and e-cigarettes. If you need help quitting, ask your health care provider.  Avoid secondhand smoke.  Limit your use of caffeine. ? Switch to decaffeinated coffee, tea, and soda. ? Avoid chocolate.  Avoid vibrating tools and machinery. General instructions  Protect your hands and feet from injuries, cuts, or bruises.  Avoid wearing tight rings or wristbands.  Wear loose fitting socks and comfortable, roomy shoes.  Take over-the-counter and prescription medicines only as told by your health care provider. Contact a health care provider if:  Your discomfort becomes worse despite lifestyle changes.  You develop sores on your fingers or toes that do not heal.  Your fingers or toes turn black.  You have breaks in the skin on your fingers or toes.  You have a fever.  You have pain or swelling in your joints.  You have a rash.  Your symptoms occur on only one side of your body. Summary  Raynaud phenomenon is a condition that affects the arteries that carry blood to your fingers, toes, ears, lips, nipples, or the tip of your nose.  In many cases, the cause of this condition is not known.  Symptoms of this condition include changes in skin color, and numbness and tingling of the affected area.  Treatment for this condition includes lifestyle changes, reducing exposure to cold temperatures, and using medicines for severe cases of the condition.  Contact your health care provider if  your condition worsens despite treatment. This information is not intended to replace advice given to you by your health care provider. Make sure you discuss any questions you have with your health care provider. Document Revised: 05/26/2019 Document Reviewed: 05/26/2019 Elsevier Patient Education  [employer].    Erythrocyte Sedimentation Rate Test Why am I having this test? The erythrocyte sedimentation rate (ESR) test is used to help find illnesses related to:  Sudden (acute) or long-term (chronic) infections.  Inflammation.  The body's disease-fighting system attacking healthy cells (autoimmune diseases).  Cancer.  Tissue death. If you have symptoms that may be related to any of these illnesses, your health care provider may do an ESR test before doing more specific tests. If you have an inflammatory immune disease, such as rheumatoid arthritis, you may have this test to help monitor your therapy. What is being tested? This test measures how long it takes for your red blood cells (erythrocytes) to settle in a solution over a certain amount of time (sedimentation rate). When you have an infection or inflammation, your red blood cells clump together and settle faster. The sedimentation rate provides information about how much inflammation is present in the body. What kind of sample is taken? A  blood sample is required for this test. It is usually collected by inserting a needle into a blood vessel.   How do I prepare for this test? Follow any instructions from your health care provider about changing or stopping your regular medicines. Tell a health care provider about:  Any allergies you have.  All medicines you are taking, including vitamins, herbs, eye drops, creams, and over-the-counter medicines.  Any blood disorders you have.  Any surgeries you have had.  Any medical conditions you have, such as thyroid or kidney disease.  Whether you are pregnant or may be  pregnant. How are the results reported? Your results will be reported as a value that measures sedimentation rate in millimeters per hour (mm/hr). Your health care provider will compare your results to normal ranges that were established after testing a large group of people (reference values). Reference values may vary among labs and hospitals. For this test, common reference values, which vary by age and gender, are:  Newborn: 0-2 mm/hr.  Child, up to puberty: 0-10 mm/hr.  Female: ? Under 50 years: 0-20 mm/hr. ? 50-85 years: 0-30 mm/hr. ? Over 85 years: 0-42 mm/hr.  Female: ? Under 50 years: 0-15 mm/hr. ? 50-85 years: 0-20 mm/hr. ? Over 85 years: 0-30 mm/hr. Certain conditions or medicines may cause ESR levels to be falsely lower or higher, such as:  Pregnancy.  Obesity.  Steroids, birth control pills, and blood thinners.  Thyroid or kidney disease. What do the results mean? Results that are within reference values are considered normal, meaning that the level of inflammation in your body is healthy. High ESR levels mean that there is inflammation in your body. You will have more tests to help make a diagnosis. Inflammation may result from many different conditions or injuries. Talk with your health care provider about what your results mean. Questions to ask your health care provider Ask your health care provider, or the department that is doing the test:  When will my results be ready?  How will I get my results?  What are my treatment options?  What other tests do I need?  What are my next steps? Summary  The erythrocyte sedimentation rate (ESR) test is used to help find illnesses associated with sudden (acute) or long-term (chronic) infections, inflammation, autoimmune diseases, cancer, or tissue death.  If you have symptoms that may be related to any of these illnesses, your health care provider may do an ESR test before doing more specific tests. If you have an  inflammatory immune disease, such as rheumatoid arthritis, you may have this test to help monitor your therapy.  This test measures how long it takes for your red blood cells (erythrocytes) to settle in a solution over a certain amount of time (sedimentation rate). This provides information about how much inflammation is present in the body.    Cardiolipin Antibodies Test Why am I having this test? Your health care provider may order a cardiolipin antibodies test if:  You have a condition in which the blood clots in an abnormal way (hypercoagulable state).  You have signs or symptoms of an autoimmune disease, especially systemic lupus erythematosus (SLE).  You have had one or more pregnancy losses (miscarriage). What is being tested? This test measures the level of cardiolipin antibodies, also known as anticardiolipin antibodies, in the blood. Antibodies are proteins made by your body's defense (immune) system. Normal antibodies protect you against germs and other things that can make you sick. Cardiolipin antibodies are abnormal  antibodies. They attack fat molecules in the outer layer of body cells and may also attack important blood-clotting cells (platelets). When your immune system attacks normal body cells, it is called an autoimmune reaction. What kind of sample is taken? A blood sample is required for this test. It is usually collected by inserting a needle into a blood vessel.   How do I prepare for this test? Let your health care provider know if you have had a recent infection and if you are taking any medicines. Some infections and medicines can cause your body to develop cardiolipin antibodies. How are the results reported? Your test results will generally be reported as either positive or negative for cardiolipin antibodies. For this test, normal results are:  Negative for cardiolipin antibodies. Other results may be reported as values. Your health care provider will compare  your results to normal ranges that were established after testing a large group of people (reference ranges). Reference ranges may vary among labs and hospitals. For this test, common normal reference ranges are:  Less than 23 GPL (IgG phospholipid units).  Less than 11 MPL (IgM phospholipid units). What do the results mean?  A negative test result means that no cardiolipin antibodies were found in your blood at the time of the test.  A positive test result, or results that are higher than the normal reference ranges, could mean that you may be at increased risk for: ? Miscarriages (if you become pregnant), low platelet counts, and blood clots. This condition is called antiphospholipid syndrome. ? An autoimmune disease, especially SLE.  A positive result may also be seen in patients who have cancer, AIDS, or an infection, or who take certain medicines. Talk with your health care provider about what your results mean. Questions to ask your health care provider Ask your health care provider, or the department that is doing the test:  When will my results be ready?  How will I get my results?  What are my treatment options?  What other tests do I need?  What are my next steps? Summary  A cardiolipin antibodies test may be done if you have abnormal blood clotting, symptoms of an autoimmune disease, such as SLE, or recurrent miscarriages.  Cardiolipin antibodies are abnormal antibodies. They attack fat molecules in the outer layer of body cells and may also attack platelets.  A positive test result could mean that you are at increased risk for miscarriages (if you become pregnant), low platelet counts, and blood clots. This condition is called antiphospholipid syndrome.   Complement Assay Test Why am I having this test? Complement refers to a group of proteins that are part of the body's disease-fighting system (immune system). A complement assay test provides information about some  or all of these proteins. You may have this test:  To diagnose a lack, or deficiency, of certain complement proteins. Deficiencies can be passed from parent to child (inherited).  To monitor an infection or autoimmune disease.  If you have unexplained inflammation or swelling (edema).  If you have bacterial infections again and again. What is being tested? This test can be used to measure:  Total complement. This is the total number of protein complements in your blood.  The number of each kind of complement in your blood. The nine main kinds of complement are labeled C1 through C9. Some of these complements, such as C3 and C4, are especially important and have many functions in the body. Depending on why you are having  the test, your health care provider may test your total complement or only some individual complements, such as C3 and C4. The total complement assay test may be done before individual complements are tested. What kind of sample is taken? A blood sample is required for this test. It is usually collected by inserting a needle into a blood vessel.   Tell a health care provider about:  Any allergies you have.  All medicines you are taking, including vitamins, herbs, eye drops, creams, and over-the-counter medicines.  Any blood disorders you have.  Any surgeries you have had.  Any medical conditions you have.  Whether you are pregnant or may be pregnant. How are the results reported? Your results will be reported as a value that tells you how much complement is in your blood. This will be given as units per milliliter of blood (units/mL) or as milligrams per deciliter of blood (mg/dL). Your results may be reported as total complement, or as individual complements, or both. Your health care provider will compare your results to normal ranges that were established after testing a large group of people (reference ranges). Reference ranges may vary among labs and hospitals.  For this test, reference ranges for some of the most commonly measured complement assays may be:  Total complement: 30-75 units/mL.  C2: 1-4 mg/dL.  C3: 75-175 mg/dL.  C4: 22-45 units/mL. What do the results mean? Results within reference ranges are considered normal, which means you have a normal amount of complement in your blood. Results that are higher than the reference ranges may be caused by:  Inflammatory disease.  Heart attack.  Cancer. Complement deficiencies, or results lower than the reference ranges, may be caused by:  Certain inherited conditions.  Autoimmune disease.  Certain liver diseases.  Malnutrition.  Certain types of anemia that result in breakdown of red blood cells (hemolytic anemia). Talk with your health care provider about what your results mean. Questions to ask your health care provider Ask your health care provider, or the department that is doing the test:  When will my results be ready?  How will I get my results?  What are my treatment options?  What other tests do I need?  What are my next steps? Summary  Complement refers to a group of proteins that are part of the body's disease-fighting system (immune system). A complement assay test can provide information about some or all of these proteins.  You may have a complement assay test to help diagnose a complement deficiency, and to monitor some infections or autoimmune disease.  Talk with your health care provider about what your results mean.    Antinuclear Antibody Test Why am I having this test? This is a test that is used to help diagnose systemic lupus erythematosus (SLE) and other autoimmune diseases. An autoimmune disease is a disease in which the body's own defense (immune)system attacks its organs. What is being tested? This test checks for antinuclear antibodies (ANA) in the blood. The presence of ANA is associated with several autoimmune diseases. It is seen in  almost all patients with lupus. What kind of sample is taken? A blood sample is required for this test. It is usually collected by inserting a needle into a blood vessel.   How are the results reported? Your test results will be reported as either positive or negative. A false-positive result can occur. A false positive is incorrect because it means that a condition is present when it is not. What do  the results mean? A positive test result may mean that you have:  Lupus.  Other autoimmune diseases, such as rheumatoid arthritis, scleroderma, or Sjgren syndrome. Conditions that may cause a false-positive result include:  Liver dysfunction.  Myasthenia gravis.  Infectious mononucleosis. Talk with your health care provider about what your results mean. Questions to ask your health care provider Ask your health care provider, or the department that is doing the test:  When will my results be ready?  How will I get my results?  What are my treatment options?  What other tests do I need?  What are my next steps? Summary  This is a test that is used to help diagnose systemic lupus erythematosus (SLE) and other autoimmune diseases. An autoimmune disease is a disease in which the body's own defense (immune)system attacks the body.  This test checks for antinuclear antibodies (ANA) in the blood. The presence of ANA is associated with several autoimmune diseases. It is seen in almost all patients with lupus.  Your test results will be reported as either positive or negative. Talk with your health care provider about what your results mean. This information is not intended to replace advice given to you by your health care provider. Make sure you discuss any questions you have with your health care provider. Document Revised: 09/16/2019 Document Reviewed: 09/16/2019 Elsevier Patient Education  [place].

## 2020-04-26 ENCOUNTER — Ambulatory Visit: Payer: Managed Care, Other (non HMO) | Admitting: Podiatry

## 2020-04-28 LAB — CARDIOLIPIN ANTIBODIES, IGG, IGM, IGA
Anticardiolipin IgA: <9 APL U/mL (ref 0–11)
Anticardiolipin IgG: <9 GPL U/mL (ref 0–14)
Anticardiolipin IgM: 11 MPL U/mL (ref 0–12)

## 2020-04-28 LAB — BETA-2-GLYCOPROTEIN I ABS, IGG/M/A
Beta-2 Glyco 1 IgA: <9 GPI IgA units (ref 0–25)
Beta-2 Glyco 1 IgM: 9 GPI IgM units (ref 0–32)
Beta-2 Glyco I IgG: <9 GPI IgG units (ref 0–20)

## 2020-04-28 LAB — C3 AND C4
Complement C3, Serum: 125 mg/dL (ref 82–167)
Complement C4, Serum: 14 mg/dL (ref 12–38)

## 2020-04-28 LAB — ANTI-DNA ANTIBODY, DOUBLE-STRANDED: dsDNA Ab: 4 IU/mL (ref 0–9)

## 2020-04-28 LAB — LUPUS ANTICOAGULANT PANEL
Dilute Viper Venom Time: 31.2 s (ref 0.0–47.0)
PTT Lupus Anticoagulant: 30.1 s (ref 0.0–51.9)

## 2020-04-28 LAB — SEDIMENTATION RATE: Sed Rate: 5 mm/hr (ref 0–40)

## 2020-04-28 LAB — ANA: Anti Nuclear Antibody (ANA): POSITIVE — AB

## 2020-04-30 ENCOUNTER — Other Ambulatory Visit: Payer: Self-pay | Admitting: Internal Medicine

## 2020-04-30 NOTE — Progress Notes (Signed)
Lab results are negative for antiphospholipid antibodies that would be associated with abnormal blood clotting contributing to symptoms. The dsDNA and complement tests are normal suggesting against lupus as  cause of current symptoms. The ANA test is positive but I do not see a specific significant manifestation or need to treat for autoimmune condition. If the hand and feet discoloration continues to bother her a lot we could try a low dose calcium channel blocker medication but this would just be for symptoms, not something needed to prevent long term damage.

## 2020-04-30 NOTE — Telephone Encounter (Signed)
Last filled 02-02-20 #20 Last OV 11-29-19 Next OV 12-04-20 [facility]

## 2020-05-08 ENCOUNTER — Ambulatory Visit: Payer: Managed Care, Other (non HMO) | Admitting: Podiatry

## 2020-07-09 ENCOUNTER — Other Ambulatory Visit: Payer: Self-pay | Admitting: Internal Medicine

## 2020-07-09 NOTE — Telephone Encounter (Signed)
Last filled [id number] #20 Last OV 11-29-19 Next OV 12-04-20 [facility]

## 2020-08-26 ENCOUNTER — Other Ambulatory Visit: Payer: Self-pay | Admitting: Internal Medicine

## 2020-08-27 NOTE — Telephone Encounter (Signed)
Last filled  07-09-20 #20 Last OV 11-29-19 Next OV 12-04-20 [facility]

## 2020-10-03 ENCOUNTER — Other Ambulatory Visit: Payer: Self-pay

## 2020-10-03 ENCOUNTER — Telehealth: Payer: Self-pay

## 2020-10-03 ENCOUNTER — Ambulatory Visit: Payer: Managed Care, Other (non HMO) | Admitting: Dermatology

## 2020-10-03 DIAGNOSIS — Z1283 Encounter for screening for malignant neoplasm of skin: Secondary | ICD-10-CM

## 2020-10-03 DIAGNOSIS — L578 Other skin changes due to chronic exposure to nonionizing radiation: Secondary | ICD-10-CM

## 2020-10-03 DIAGNOSIS — L821 Other seborrheic keratosis: Secondary | ICD-10-CM

## 2020-10-03 DIAGNOSIS — L814 Other melanin hyperpigmentation: Secondary | ICD-10-CM | POA: Diagnosis not present

## 2020-10-03 DIAGNOSIS — Z1211 Encounter for screening for malignant neoplasm of colon: Secondary | ICD-10-CM

## 2020-10-03 DIAGNOSIS — D229 Melanocytic nevi, unspecified: Secondary | ICD-10-CM

## 2020-10-03 DIAGNOSIS — D1800 Hemangioma unspecified site: Secondary | ICD-10-CM

## 2020-10-03 NOTE — Telephone Encounter (Signed)
Called patient and requested that she get [place] a new referral the one in system is over a year old patient will call us back to schedule after she gets a new referral sent

## 2020-10-03 NOTE — Telephone Encounter (Signed)
Pt. Called to schedule a colonoscopy.Marland Kitchen

## 2020-10-03 NOTE — Patient Instructions (Signed)

## 2020-10-03 NOTE — Progress Notes (Signed)
   Follow-Up Visit   Subjective  Madeline Reid is a 50 y.o. female who presents for the following: Annual Exam. The patient presents for Total-Body Skin Exam (TBSE) for skin cancer screening and mole check.  The following portions of the chart were reviewed this encounter and updated as appropriate:   Tobacco  Allergies  Meds  Problems  Med Hx  Surg Hx  Fam Hx     Review of Systems:  No other skin or systemic complaints except as noted in HPI or Assessment and Plan.  Objective  Well appearing patient in no apparent distress; mood and affect are within normal limits.  A full examination was performed including scalp, head, eyes, ears, nose, lips, neck, chest, axillae, abdomen, back, buttocks, bilateral upper extremities, bilateral lower extremities, hands, feet, fingers, toes, fingernails, and toenails. All findings within normal limits unless otherwise noted below.  Face, trunk, extremities Diffuse scaly erythematous macules with underlying dyspigmentation.    Assessment & Plan  Actinic skin damage Face, trunk, extremities  Actinic Damage - Chronic condition, secondary to cumulative UV/sun exposure - diffuse scaly erythematous macules with underlying dyspigmentation - Recommend daily broad spectrum sunscreen SPF 30+ to sun-exposed areas, reapply every 2 hours as needed.  - Staying in the shade or wearing long sleeves, sun glasses (UVA+UVB protection) and wide brim hats (4-inch brim around the entire circumference of the hat) are also recommended for sun protection.  - Call for new or changing lesions.  Skin cancer screening  Lentigines - Scattered tan macules - Due to sun exposure - Benign-appering, observe - Recommend daily broad spectrum sunscreen SPF 30+ to sun-exposed areas, reapply every 2 hours as needed. - Call for any changes  Seborrheic Keratoses - Stuck-on, waxy, tan-brown papules and/or plaques  - Benign-appearing - Discussed benign etiology and  prognosis. - Observe - Call for any changes  Melanocytic Nevi - Tan-brown and/or pink-flesh-colored symmetric macules and papules - Benign appearing on exam today - Observation - Call clinic for new or changing moles - Recommend daily use of broad spectrum spf 30+ sunscreen to sun-exposed areas.   Hemangiomas - Red papules - Discussed benign nature - Observe - Call for any changes  Skin cancer screening performed today.  Return in about 1 year (around 10/03/2021) for TBSE.  Luther Redo, CMA, am acting as scribe for Sarina Ser, MD . Documentation: I have reviewed the above documentation for accuracy and completeness, and I agree with the above.  Sarina Ser, MD

## 2020-10-04 ENCOUNTER — Encounter: Payer: Self-pay | Admitting: Dermatology

## 2020-10-04 ENCOUNTER — Other Ambulatory Visit (INDEPENDENT_AMBULATORY_CARE_PROVIDER_SITE_OTHER): Payer: Self-pay

## 2020-10-04 DIAGNOSIS — Z1211 Encounter for screening for malignant neoplasm of colon: Secondary | ICD-10-CM

## 2020-10-04 MED ORDER — CLENPIQ 10-3.5-12 MG-GM -GM/160ML PO SOLN: 1.0000 | ORAL | 0 refills | Status: DC

## 2020-10-04 NOTE — Progress Notes (Signed)
Gastroenterology Pre-Procedure Review  Request Date: 10/25/2020 Requesting Physician: Dr. Marius Ditch  PATIENT REVIEW QUESTIONS: The patient responded to the following health history questions as indicated:    1. Are you having any GI issues? no 2. Do you have a personal history of Polyps? no 3. Do you have a family history of Colon Cancer or Polyps? yes (polyps but benine) 4. Diabetes Mellitus? no 5. Joint replacements in the past 12 months?no 6. Major health problems in the past 3 months?no 7. Any artificial heart valves, MVP, or defibrillator?no    MEDICATIONS & ALLERGIES:    Patient reports the following regarding taking any anticoagulation/antiplatelet therapy:   Plavix, Coumadin, Eliquis, Xarelto, Lovenox, Pradaxa, Brilinta, or Effient? no Aspirin? no  Patient confirms/reports the following medications:  Current Outpatient Medications  Medication Sig Dispense Refill   ALPRAZolam (XANAX) 0.25 MG tablet TAKE 1 TABLET BY MOUTH TWICE A DAY AS NEEDED FOR ANXIETY 20 tablet 0   Black Elderberry (SAMBUCUS ELDERBERRY PO) Take by mouth. 500 mg zinc + vitamin c     EPINEPHrine (AUVI-Q) 0.3 mg/0.3 mL IJ SOAJ injection Inject 0.3 mLs (0.3 mg total) into the muscle as needed. 2 each 2   estradiol (ESTRACE) 1 MG tablet Take 1 tablet (1 mg total) by mouth daily. 90 tablet 3   KRILL OIL PO Take by mouth.     losartan (COZAAR) 25 MG tablet TAKE 1 TABLET BY MOUTH  DAILY 90 tablet 3   medroxyPROGESTERone (PROVERA) 2.5 MG tablet Take 1 tablet (2.5 mg total) by mouth daily. 90 tablet 3   MILK THISTLE PO Take 7,500 mg by mouth daily. 80% silymarin flavonoids     niacin 500 MG tablet Take by mouth.     Turmeric 500 MG CAPS Take 1,000 mg by mouth daily.     No current facility-administered medications for this visit.    Patient confirms/reports the following allergies:  Allergies  Allergen Reactions   Lisinopril     Potential cough    No orders of the defined types were placed in this  encounter.   AUTHORIZATION INFORMATION Primary Insurance: 1D#: Group #:  Secondary Insurance: 1D#: Group #:  SCHEDULE INFORMATION: Date: 10/25/2020 Time: Location: [facility]

## 2020-10-10 ENCOUNTER — Encounter: Payer: Self-pay | Admitting: Internal Medicine

## 2020-10-22 ENCOUNTER — Telehealth: Payer: Self-pay

## 2020-10-22 NOTE — Telephone Encounter (Signed)
Pt. Calling to reschedule procedure 

## 2020-10-22 NOTE — Telephone Encounter (Signed)
Spoke with patient got her rescheduled for 11/29/2020 called endo also

## 2020-10-24 ENCOUNTER — Other Ambulatory Visit: Payer: Self-pay | Admitting: Internal Medicine

## 2020-11-27 ENCOUNTER — Telehealth: Payer: Self-pay

## 2020-11-27 ENCOUNTER — Other Ambulatory Visit: Payer: Self-pay

## 2020-11-27 MED ORDER — PEG 3350-KCL-NA BICARB-NACL 420 G PO SOLR: 4000.0000 mL | Freq: Once | ORAL | 0 refills | Status: AC

## 2020-11-27 MED ORDER — NA SULFATE-K SULFATE-MG SULF 17.5-3.13-1.6 GM/177ML PO SOLN: 1.0000 | Freq: Once | ORAL | 0 refills | Status: AC

## 2020-11-27 NOTE — Telephone Encounter (Signed)
Pt. Calling to request another prep she said the pharmacy is charging her 100.00 for the prep. She is requesting a call back to discuss other prep options

## 2020-11-27 NOTE — Progress Notes (Signed)
Patient wanted a different prep sent in to pharmacy and a Posey so done

## 2020-11-29 ENCOUNTER — Other Ambulatory Visit: Payer: Self-pay

## 2020-11-29 ENCOUNTER — Encounter: Payer: Self-pay | Admitting: Gastroenterology

## 2020-11-29 ENCOUNTER — Ambulatory Visit: Payer: Managed Care, Other (non HMO) | Admitting: Certified Registered Nurse Anesthetist

## 2020-11-29 ENCOUNTER — Encounter
Admission: RE | Disposition: A | Discharge status: Home / Self Care | Payer: Self-pay | Source: Home / Self Care | Attending: Gastroenterology

## 2020-11-29 ENCOUNTER — Ambulatory Visit
Admission: RE | Admit: 2020-11-29 | Discharge: 2020-11-29 | Disposition: A | Discharge status: Home / Self Care | Payer: Managed Care, Other (non HMO) | Attending: Gastroenterology | Admitting: Gastroenterology

## 2020-11-29 DIAGNOSIS — Z79899 Other long term (current) drug therapy: Secondary | ICD-10-CM | POA: Diagnosis not present

## 2020-11-29 DIAGNOSIS — D120 Benign neoplasm of cecum: Secondary | ICD-10-CM | POA: Insufficient documentation

## 2020-11-29 DIAGNOSIS — Z1211 Encounter for screening for malignant neoplasm of colon: Secondary | ICD-10-CM

## 2020-11-29 DIAGNOSIS — Z888 Allergy status to other drugs, medicaments and biological substances status: Secondary | ICD-10-CM | POA: Insufficient documentation

## 2020-11-29 DIAGNOSIS — K5730 Diverticulosis of large intestine without perforation or abscess without bleeding: Secondary | ICD-10-CM | POA: Diagnosis not present

## 2020-11-29 DIAGNOSIS — D126 Benign neoplasm of colon, unspecified: Secondary | ICD-10-CM | POA: Diagnosis not present

## 2020-11-29 DIAGNOSIS — D122 Benign neoplasm of ascending colon: Secondary | ICD-10-CM | POA: Insufficient documentation

## 2020-11-29 DIAGNOSIS — Z87891 Personal history of nicotine dependence: Secondary | ICD-10-CM | POA: Insufficient documentation

## 2020-11-29 HISTORY — PX: COLONOSCOPY WITH PROPOFOL: SHX5780

## 2020-11-29 SURGERY — COLONOSCOPY WITH PROPOFOL
Anesthesia: General

## 2020-11-29 MED ORDER — DEXMEDETOMIDINE HCL IN NACL 200 MCG/50ML IV SOLN
INTRAVENOUS | Status: AC
  Filled 2020-11-29: qty 50

## 2020-11-29 MED ORDER — SODIUM CHLORIDE 0.9 % IV SOLN: INTRAVENOUS | Status: DC

## 2020-11-29 MED ORDER — DEXMEDETOMIDINE (PRECEDEX) IN NS 20 MCG/5ML (4 MCG/ML) IV SYRINGE
PREFILLED_SYRINGE | INTRAVENOUS | Status: DC | PRN
  Administered 2020-11-29: 4 ug via INTRAVENOUS

## 2020-11-29 MED ORDER — LIDOCAINE HCL (CARDIAC) PF 100 MG/5ML IV SOSY
PREFILLED_SYRINGE | INTRAVENOUS | Status: DC | PRN
  Administered 2020-11-29: 40 mg via INTRAVENOUS

## 2020-11-29 MED ORDER — PROPOFOL 500 MG/50ML IV EMUL
INTRAVENOUS | Status: AC
  Filled 2020-11-29: qty 50

## 2020-11-29 MED ORDER — PROPOFOL 500 MG/50ML IV EMUL
INTRAVENOUS | Status: DC | PRN
  Administered 2020-11-29: 150 ug/kg/min via INTRAVENOUS

## 2020-11-29 MED ORDER — PROPOFOL 10 MG/ML IV BOLUS
INTRAVENOUS | Status: DC | PRN
  Administered 2020-11-29: 50 mg via INTRAVENOUS
  Administered 2020-11-29: 20 mg via INTRAVENOUS

## 2020-11-29 MED ORDER — LIDOCAINE HCL (PF) 2 % IJ SOLN
INTRAMUSCULAR | Status: AC
  Filled 2020-11-29: qty 5

## 2020-11-29 NOTE — H&P (Signed)
Madeline Darby, MD [address]  [place]  [place], [place] [address]  Main: [phone]  Fax: [phone] Pager: [phone]  Primary Care Physician:  Venia Carbon, MD Primary Gastroenterologist:  Dr. Cephas Reid  Pre-Procedure History & Physical: HPI:  Madeline Reid is a 50 y.o. female is here for an colonoscopy.   Past Medical History:  Diagnosis Date   Atopic dermatitis versus contact dermatitis 12/20/2018   Endometriosis    GERD (gastroesophageal reflux disease)    Hypertension    Urticaria     Past Surgical History:  Procedure Laterality Date   LAPAROSCOPY  1992   endometriosis   MYOMECTOMY  2007   removed    Prior to Admission medications   Medication Sig Start Date End Date Taking? Authorizing Provider  ALPRAZolam (XANAX) 0.25 MG tablet TAKE 1 TABLET BY MOUTH TWICE A DAY AS NEEDED FOR ANXIETY 08/27/20  Yes Venia Carbon, MD  Black Elderberry (SAMBUCUS ELDERBERRY PO) Take by mouth. 500 mg zinc + vitamin c   Yes [provider]  estradiol (ESTRACE) 1 MG tablet TAKE 1 TABLET BY MOUTH  DAILY Patient not taking: Reported on 11/29/2020 10/25/20   Venia Carbon, MD  KRILL OIL PO Take by mouth.   Yes [provider]  losartan (COZAAR) 25 MG tablet TAKE 1 TABLET BY MOUTH  DAILY 12/09/19  Yes Venia Carbon, MD  medroxyPROGESTERone (PROVERA) 2.5 MG tablet TAKE 1 TABLET BY MOUTH  DAILY Patient not taking: Reported on 11/29/2020 10/25/20   Viviana Simpler I, MD  MILK THISTLE PO Take 7,500 mg by mouth daily. 80% silymarin flavonoids   Yes [provider]  niacin 500 MG tablet Take by mouth.   Yes [provider]  Turmeric 500 MG CAPS Take 1,000 mg by mouth daily.   Yes [provider]  EPINEPHrine (AUVI-Q) 0.3 mg/0.3 mL IJ SOAJ injection Inject 0.3 mLs (0.3 mg total) into the muscle as needed. 12/29/18   Bobbitt, Sedalia Muta, MD  Sod Picosulfate-Mag Ox-Cit Acd [facility]) 10-3.5-12 MG-GM -GM/160ML SOLN Take  1 kit by mouth as directed. At 5 PM evening before procedure, drink 1 bottle of Clenpiq, hydrate, drink (5) 8 oz of water. Then do the same thing 5 hours prior to your procedure. 10/04/20   Lin Landsman, MD  omeprazole (PRILOSEC) 20 MG capsule Take 20 mg by mouth daily.    04/15/11  [provider]    Allergies as of 10/04/2020 - Review Complete 10/04/2020  Allergen Reaction Noted   Lisinopril  12/02/2006    Family History  Problem Relation Age of Onset   Eczema Mother    Cancer Paternal Grandfather        throat cancer   Coronary artery disease Maternal Grandmother    Coronary artery disease Paternal Grandmother    Eczema Maternal Grandfather    Colon cancer Neg Hx    Breast cancer Neg Hx     Social History   Socioeconomic History   Marital status: Married    Spouse name: Not on file   Number of children: 2   Years of education: Not on file   Highest education level: Not on file  Occupational History   Occupation: revenue Printmaker: [employer]  Tobacco Use   Smoking status: Former    Types: Cigarettes    Quit date: 08/28/2006    Years since quitting: 14.2   Smokeless tobacco: Never  Vaping Use  Vaping Use: Never used  Substance and Sexual Activity   Alcohol use: Yes    Comment: occ   Drug use: No   Sexual activity: Not on file  Other Topics Concern   Not on file  Social History Narrative   Adopted 2 children in 2012            Social Determinants of Health   Financial Resource Strain: Not on file  Food Insecurity: Not on file  Transportation Needs: Not on file  Physical Activity: Not on file  Stress: Not on file  Social Connections: Not on file  Intimate Partner Violence: Not on file    Review of Systems: See HPI, otherwise negative ROS  Physical Exam: BP (!) 134/96   Pulse 91   Temp (!) 96.9 F (36.1 C) (Temporal)   Resp 16   Ht 5' 5"  (1.651 m)   Wt 53.1 kg   LMP 02/29/2020   SpO2 100%   BMI 19.47 kg/m  General:    Alert,  pleasant and cooperative in NAD Head:  Normocephalic and atraumatic. Neck:  Supple; no masses or thyromegaly. Lungs:  Clear throughout to auscultation.    Heart:  Regular rate and rhythm. Abdomen:  Soft, nontender and nondistended. Normal bowel sounds, without guarding, and without rebound.   Neurologic:  Alert and  oriented x4;  grossly normal neurologically.  Impression/Plan: Madeline Reid is here for an colonoscopy to be performed for colon cancer screening  Risks, benefits, limitations, and alternatives regarding  colonoscopy have been reviewed with the patient.  Questions have been answered.  All parties agreeable.   Sherri Sear, MD  11/29/2020, 7:49 AM

## 2020-11-29 NOTE — Anesthesia Postprocedure Evaluation (Signed)
Anesthesia Post Note  Patient: Madeline Reid  Procedure(s) Performed: COLONOSCOPY WITH PROPOFOL  Patient location during evaluation: PACU Anesthesia Type: General Level of consciousness: awake and awake and alert Pain management: pain level controlled Vital Signs Assessment: post-procedure vital signs reviewed and stable Respiratory status: spontaneous breathing and respiratory function stable Cardiovascular status: blood pressure returned to baseline Anesthetic complications: no   No notable events documented.   Last Vitals:  Vitals:   11/29/20 0827 11/29/20 0837  BP: 113/71 118/78  Pulse: 70   Resp: 16   Temp:    SpO2: 100%     Last Pain:  Vitals:   11/29/20 0827  TempSrc:   PainSc: 0-No pain                 VAN STAVEREN,Alexavier Tsutsui

## 2020-11-29 NOTE — Transfer of Care (Signed)
Immediate Anesthesia Transfer of Care Note  Patient: Madeline Reid  Procedure(s) Performed: COLONOSCOPY WITH PROPOFOL  Patient Location: Endoscopy Unit  Anesthesia Type:General  Level of Consciousness: awake and alert   Airway & Oxygen Therapy: Patient Spontanous Breathing  Post-op Assessment: Report given to RN and Post -op Vital signs reviewed and stable  Post vital signs: Reviewed and stable  Last Vitals:  Vitals Value Taken Time  BP 96/50 11/29/20 0818  Temp 35.6 C 11/29/20 0817  Pulse 81 11/29/20 0818  Resp 25 11/29/20 0818  SpO2 99 % 11/29/20 0818  Vitals shown include unvalidated device data.  Last Pain:  Vitals:   11/29/20 0817  TempSrc:   PainSc: Asleep         Complications: No notable events documented.

## 2020-11-29 NOTE — Op Note (Signed)
[facility] Gastroenterology Patient Name: Madeline Reid Procedure Date: 11/29/2020 7:14 AM MRN: [id number] Account #: [id number] Date of Birth: 09/08/1970 Admit Type: Outpatient Age: 50 Room: [facility] ENDO ROOM 4 Gender: Female Note Status: Finalized Instrument Name: Jasper Riling [id number] Procedure:             Colonoscopy Indications:           Screening for colorectal malignant neoplasm, This is                         the patient's first colonoscopy Providers:             Lin Landsman MD, MD Referring MD:          Venia Carbon (Referring MD) Medicines:             General Anesthesia Complications:         No immediate complications. Estimated blood loss:                         Minimal. Procedure:             Pre-Anesthesia Assessment:                        - Prior to the procedure, a History and Physical was                         performed, and patient medications and allergies were                         reviewed. The patient is competent. The risks and                         benefits of the procedure and the sedation options and                         risks were discussed with the patient. All questions                         were answered and informed consent was obtained.                         Patient identification and proposed procedure were                         verified by the physician, the nurse, the                         anesthesiologist, the anesthetist and the technician                         in the pre-procedure area in the procedure room in the                         endoscopy suite. Mental Status Examination: alert and                         oriented. Airway Examination: normal oropharyngeal  airway and neck mobility. Respiratory Examination:                         clear to auscultation. CV Examination: normal.                         Prophylactic Antibiotics: The patient does not require                          prophylactic antibiotics. Prior Anticoagulants: The                         patient has taken no previous anticoagulant or                         antiplatelet agents. ASA Grade Assessment: II - A                         patient with mild systemic disease. After reviewing                         the risks and benefits, the patient was deemed in                         satisfactory condition to undergo the procedure. The                         anesthesia plan was to use general anesthesia.                         Immediately prior to administration of medications,                         the patient was re-assessed for adequacy to receive                         sedatives. The heart rate, respiratory rate, oxygen                         saturations, blood pressure, adequacy of pulmonary                         ventilation, and response to care were monitored                         throughout the procedure. The physical status of the                         patient was re-assessed after the procedure.                        After obtaining informed consent, the colonoscope was                         passed under direct vision. Throughout the procedure,                         the patient's blood pressure, pulse, and oxygen  saturations were monitored continuously. The                         Colonoscope was introduced through the anus and                         advanced to the the terminal ileum, with                         identification of the appendiceal orifice and IC                         valve. The colonoscopy was performed without                         difficulty. The patient tolerated the procedure well.                         The quality of the bowel preparation was evaluated                         using the BBPS [facility] Bowel Preparation Scale) with                         scores of: Right Colon = 3, Transverse Colon = 3 and                          Left Colon = 3 (entire mucosa seen well with no                         residual staining, small fragments of stool or opaque                         liquid). The total BBPS score equals 9. Findings:      The perianal and digital rectal examinations were normal. Pertinent       negatives include normal sphincter tone and no palpable rectal lesions.      The terminal ileum appeared normal.      Two sessile polyps were found in the ascending colon. The polyps were 4       to 7 mm in size. These polyps were removed with a cold snare. Resection       and retrieval were complete. Estimated blood loss was minimal.      The retroflexed view of the distal rectum and anal verge was normal and       showed no anal or rectal abnormalities.      A few diverticula were found in the sigmoid colon, descending colon and       ascending colon. Impression:            - The examined portion of the ileum was normal.                        - Two 4 to 7 mm polyps in the ascending colon, removed                         with a cold snare. Resected and retrieved.                        -  The distal rectum and anal verge are normal on                         retroflexion view.                        - Diverticulosis in the sigmoid colon, in the                         descending colon and in the ascending colon. Recommendation:        - Discharge patient to home (with escort).                        - Resume previous diet today.                        - Continue present medications.                        - Await pathology results.                        - Repeat colonoscopy in 5 years for surveillance. Procedure Code(s):     --- Professional ---                        [phone], Colonoscopy, flexible; with removal of                         tumor(s), polyp(s), or other lesion(s) by snare                         technique Diagnosis Code(s):     --- Professional ---                        Z12.11,  Encounter for screening for malignant neoplasm                         of colon                        K63.5, Polyp of colon                        K57.30, Diverticulosis of large intestine without                         perforation or abscess without bleeding CPT copyright 2019 American Medical Association. All rights reserved. The codes documented in this report are preliminary and upon coder review may  be revised to meet current compliance requirements. Dr. Ulyess Mort Lin Landsman MD, MD 11/29/2020 8:17:22 AM This report has been signed electronically. Number of Addenda: 0 Note Initiated On: 11/29/2020 7:14 AM Scope Withdrawal Time: 0 hours 10 minutes 5 seconds  Total Procedure Duration: 0 hours 15 minutes 56 seconds  Estimated Blood Loss:  Estimated blood loss was minimal.      [facility]

## 2020-11-29 NOTE — Anesthesia Preprocedure Evaluation (Signed)
Anesthesia Evaluation  Patient identified by MRN, date of birth, ID band Patient awake    Reviewed: Allergy & Precautions, NPO status , Patient's Chart, lab work & pertinent test results  Airway Mallampati: II  TM Distance: >3 FB Neck ROM: full    Dental  (+) Teeth Intact   Pulmonary neg pulmonary ROS, former smoker,    Pulmonary exam normal breath sounds clear to auscultation       Cardiovascular Exercise Tolerance: Good hypertension, Pt. on medications negative cardio ROS Normal cardiovascular exam Rhythm:Regular Rate:Normal     Neuro/Psych negative neurological ROS  negative psych ROS   GI/Hepatic negative GI ROS, Neg liver ROS, GERD  ,  Endo/Other  negative endocrine ROS  Renal/GU negative Renal ROS  negative genitourinary   Musculoskeletal   Abdominal Normal abdominal exam  (+)   Peds negative pediatric ROS (+)  Hematology negative hematology ROS (+)   Anesthesia Other Findings Past Medical History: 12/20/2018: Atopic dermatitis versus contact dermatitis No date: Endometriosis No date: GERD (gastroesophageal reflux disease) No date: Hypertension No date: Urticaria  Past Surgical History: 1992: LAPAROSCOPY     Comment:  endometriosis 2007: MYOMECTOMY     Comment:  removed  BMI    Body Mass Index: 19.47 kg/m      Reproductive/Obstetrics negative OB ROS                             Anesthesia Physical Anesthesia Plan  ASA: 2  Anesthesia Plan: General   Post-op Pain Management:    Induction: Intravenous  PONV Risk Score and Plan: Propofol infusion and TIVA  Airway Management Planned: Nasal Cannula  Additional Equipment:   Intra-op Plan:   Post-operative Plan:   Informed Consent: I have reviewed the patients History and Physical, chart, labs and discussed the procedure including the risks, benefits and alternatives for the proposed anesthesia with the patient  or authorized representative who has indicated his/her understanding and acceptance.     Dental Advisory Given  Plan Discussed with: CRNA and Surgeon  Anesthesia Plan Comments:         Anesthesia Quick Evaluation

## 2020-11-30 ENCOUNTER — Encounter: Payer: Self-pay | Admitting: Gastroenterology

## 2020-11-30 LAB — SURGICAL PATHOLOGY

## 2020-12-03 ENCOUNTER — Encounter: Payer: Self-pay | Admitting: Gastroenterology

## 2020-12-04 ENCOUNTER — Ambulatory Visit (INDEPENDENT_AMBULATORY_CARE_PROVIDER_SITE_OTHER): Payer: Managed Care, Other (non HMO) | Admitting: Internal Medicine

## 2020-12-04 ENCOUNTER — Encounter: Payer: Self-pay | Admitting: Internal Medicine

## 2020-12-04 ENCOUNTER — Other Ambulatory Visit: Payer: Self-pay

## 2020-12-04 VITALS — BP 130/88 | HR 70 | Temp 97.8°F | Ht 65.0 in | Wt 119.0 lb

## 2020-12-04 DIAGNOSIS — Z0000 Encounter for general adult medical examination without abnormal findings: Secondary | ICD-10-CM | POA: Diagnosis not present

## 2020-12-04 DIAGNOSIS — I10 Essential (primary) hypertension: Secondary | ICD-10-CM | POA: Diagnosis not present

## 2020-12-04 DIAGNOSIS — I7300 Raynaud's syndrome without gangrene: Secondary | ICD-10-CM

## 2020-12-04 NOTE — Progress Notes (Signed)
Subjective:    Patient ID: Madeline Reid, female    DOB: 1971/01/22, 50 y.o.   MRN: [id number]  HPI Here for physical This visit occurred during the SARS-CoV-2 public health emergency.  Safety protocols were in place, including screening questions prior to the visit, additional usage of staff PPE, and extensive cleaning of exam room while observing appropriate contact time as indicated for disinfecting solutions.   Had positive ANA--done by podiatrist due to Raynaud's Rest of the labs at rheumatologist were fine  Still follows a keto diet--but not as strict Limited carbs  No problems with losartan  Still works from home for [employer]  Current Outpatient Medications on File Prior to Visit  Medication Sig Dispense Refill   ALPRAZolam (XANAX) 0.25 MG tablet TAKE 1 TABLET BY MOUTH TWICE A DAY AS NEEDED FOR ANXIETY 20 tablet 0   Black Elderberry (SAMBUCUS ELDERBERRY PO) Take by mouth. 500 mg zinc + vitamin c     EPINEPHrine (AUVI-Q) 0.3 mg/0.3 mL IJ SOAJ injection Inject 0.3 mLs (0.3 mg total) into the muscle as needed. 2 each 2   KRILL OIL PO Take by mouth.     losartan (COZAAR) 25 MG tablet TAKE 1 TABLET BY MOUTH  DAILY 90 tablet 3   MILK THISTLE PO Take 7,500 mg by mouth daily. 80% silymarin flavonoids     niacin 500 MG tablet Take by mouth.     Turmeric 500 MG CAPS Take 1,000 mg by mouth daily.     [DISCONTINUED] omeprazole (PRILOSEC) 20 MG capsule Take 20 mg by mouth daily.       No current facility-administered medications on file prior to visit.    Allergies  Allergen Reactions   Lisinopril     Potential cough    Past Medical History:  Diagnosis Date   Atopic dermatitis versus contact dermatitis 12/20/2018   Endometriosis    GERD (gastroesophageal reflux disease)    Hypertension    Urticaria     Past Surgical History:  Procedure Laterality Date   COLONOSCOPY WITH PROPOFOL N/A 11/29/2020   Procedure: COLONOSCOPY WITH PROPOFOL;  Surgeon: Lin Landsman, MD;   Location: [facility] ENDOSCOPY;  Service: Gastroenterology;  Laterality: N/A;   LAPAROSCOPY  1992   endometriosis   MYOMECTOMY  2007   removed    Family History  Problem Relation Age of Onset   Eczema Mother    Cancer Paternal Grandfather        throat cancer   Coronary artery disease Maternal Grandmother    Coronary artery disease Paternal Grandmother    Eczema Maternal Grandfather    Colon cancer Neg Hx    Breast cancer Neg Hx     Social History   Socioeconomic History   Marital status: Married    Spouse name: Not on file   Number of children: 2   Years of education: Not on file   Highest education level: Not on file  Occupational History   Occupation: revenue Printmaker: [employer]  Tobacco Use   Smoking status: Former    Types: Cigarettes    Quit date: 08/28/2006    Years since quitting: 14.2   Smokeless tobacco: Never  Vaping Use   Vaping Use: Never used  Substance and Sexual Activity   Alcohol use: Yes    Comment: occ   Drug use: No   Sexual activity: Not on file  Other Topics Concern   Not on file  Social History Narrative  Adopted 2 children in 2012            Social Determinants of Health   Financial Resource Strain: Not on file  Food Insecurity: Not on file  Transportation Needs: Not on file  Physical Activity: Not on file  Stress: Not on file  Social Connections: Not on file  Intimate Partner Violence: Not on file   Review of Systems  Constitutional:  Negative for fatigue and unexpected weight change.       Wears seat belt  HENT:  Positive for tinnitus. Negative for dental problem and hearing loss.        Keeps up with dentist  Eyes:  Negative for visual disturbance.       No diplopia or unilateral vision loss  Respiratory:  Negative for cough, chest tightness and shortness of breath.   Cardiovascular:  Negative for chest pain and leg swelling.       Some palpitations with stress---will use the alprazolam prn  Gastrointestinal:   Negative for blood in stool and constipation.       No heartburn  Endocrine: Negative for polydipsia and polyuria.  Genitourinary:  Negative for dyspareunia, dysuria and hematuria.  Musculoskeletal:  Negative for arthralgias, back pain and joint swelling.  Skin:  Negative for rash.  Allergic/Immunologic: Positive for environmental allergies. Negative for immunocompromised state.       Mild spring allergies---loratadine helps  Neurological:  Negative for dizziness, syncope, light-headedness and headaches.  Hematological:  Negative for adenopathy. Does not bruise/bleed easily.  Psychiatric/Behavioral:  Negative for dysphoric mood and sleep disturbance. The patient is nervous/anxious.       Objective:   Physical Exam Constitutional:      Appearance: Normal appearance.  HENT:     Right Ear: Tympanic membrane and ear canal normal.     Left Ear: Tympanic membrane and ear canal normal.     Mouth/Throat:     Pharynx: No oropharyngeal exudate or posterior oropharyngeal erythema.  Eyes:     Conjunctiva/sclera: Conjunctivae normal.     Pupils: Pupils are equal, round, and reactive to light.  Cardiovascular:     Rate and Rhythm: Normal rate and regular rhythm.     Pulses: Normal pulses.     Heart sounds: No murmur heard.   No gallop.  Pulmonary:     Effort: Pulmonary effort is normal.     Breath sounds: Normal breath sounds. No wheezing or rales.  Abdominal:     Palpations: Abdomen is soft.     Tenderness: There is no abdominal tenderness.  Musculoskeletal:     Cervical back: Neck supple.     Right lower leg: No edema.     Left lower leg: No edema.  Lymphadenopathy:     Cervical: No cervical adenopathy.  Skin:    Findings: No lesion or rash.  Neurological:     General: No focal deficit present.     Mental Status: She is alert and oriented to person, place, and time.  Psychiatric:        Mood and Affect: Mood normal.        Behavior: Behavior normal.           Assessment &  Plan:

## 2020-12-04 NOTE — Assessment & Plan Note (Signed)
Healthy Stays fit Colon just done--due again 2027 Recent mammogram at [facility] back for follow up in [facility] Pap at gyn Recommended the bivalent COVID Had flu vaccine

## 2020-12-04 NOTE — Assessment & Plan Note (Signed)
If worsens, could consider a CCB

## 2020-12-04 NOTE — Assessment & Plan Note (Signed)
BP Readings from Last 3 Encounters:  12/04/20 130/88  11/29/20 118/78  04/20/20 121/79   Good control on low dose losartan

## 2020-12-05 LAB — CBC
Hematocrit: 39.1 % (ref 34.0–46.6)
Hemoglobin: 13.4 g/dL (ref 11.1–15.9)
MCH: 33.6 pg — ABNORMAL HIGH (ref 26.6–33.0)
MCHC: 34.3 g/dL (ref 31.5–35.7)
MCV: 98 fL — ABNORMAL HIGH (ref 79–97)
Platelets: 256 10*3/uL (ref 150–450)
RBC: 3.99 x10E6/uL (ref 3.77–5.28)
RDW: 12 % (ref 11.7–15.4)
WBC: 3.9 10*3/uL (ref 3.4–10.8)

## 2020-12-05 LAB — LIPID PANEL
Chol/HDL Ratio: 2.3 ratio (ref 0.0–4.4)
Cholesterol, Total: 202 mg/dL — ABNORMAL HIGH (ref 100–199)
HDL: 87 mg/dL (ref >39)
LDL Chol Calc (NIH): 95 mg/dL (ref 0–99)
Triglycerides: 117 mg/dL (ref 0–149)
VLDL Cholesterol Cal: 20 mg/dL (ref 5–40)

## 2020-12-05 LAB — COMPREHENSIVE METABOLIC PANEL
ALT: 21 IU/L (ref 0–32)
AST: 21 IU/L (ref 0–40)
Albumin/Globulin Ratio: 1.7 (ref 1.2–2.2)
Albumin: 4.5 g/dL (ref 3.8–4.9)
Alkaline Phosphatase: 49 IU/L (ref 44–121)
BUN/Creatinine Ratio: 22 (ref 9–23)
BUN: 13 mg/dL (ref 6–24)
Bilirubin Total: 0.3 mg/dL (ref 0.0–1.2)
CO2: 24 mmol/L (ref 20–29)
Calcium: 9.8 mg/dL (ref 8.7–10.2)
Chloride: 100 mmol/L (ref 96–106)
Creatinine, Ser: 0.6 mg/dL (ref 0.57–1.00)
Globulin, Total: 2.6 g/dL (ref 1.5–4.5)
Glucose: 94 mg/dL (ref 70–99)
Potassium: 4 mmol/L (ref 3.5–5.2)
Sodium: 142 mmol/L (ref 134–144)
Total Protein: 7.1 g/dL (ref 6.0–8.5)
eGFR: 108 mL/min/{1.73_m2} (ref >59)

## 2020-12-13 ENCOUNTER — Other Ambulatory Visit: Payer: Self-pay | Admitting: Internal Medicine

## 2021-03-28 ENCOUNTER — Other Ambulatory Visit: Payer: Self-pay

## 2021-03-28 ENCOUNTER — Ambulatory Visit (INDEPENDENT_AMBULATORY_CARE_PROVIDER_SITE_OTHER): Payer: Managed Care, Other (non HMO) | Admitting: Nurse Practitioner

## 2021-03-28 ENCOUNTER — Encounter: Payer: Self-pay | Admitting: Nurse Practitioner

## 2021-03-28 VITALS — BP 134/86 | Ht 64.0 in | Wt 123.0 lb

## 2021-03-28 DIAGNOSIS — Z780 Asymptomatic menopausal state: Secondary | ICD-10-CM | POA: Diagnosis not present

## 2021-03-28 DIAGNOSIS — Z01419 Encounter for gynecological examination (general) (routine) without abnormal findings: Secondary | ICD-10-CM

## 2021-03-28 NOTE — Progress Notes (Signed)
? ?[name] Dec 23, 1970 [id number] ? ? ?History:  51 y.o. G0 presents as new patient to establish care. No GYN complaints. Postmenopausal - no HRT, no bleeding. LMP sometimes in 2021. She did have bleeding Madeline year ago when she started on HRT elsewhere but she stopped taking and has had no bleeding since. Normal pap history. History of endometriosis diagnosed laparoscopically. HTN managed by PCP.  ? ?Gynecologic History ?Patient's last menstrual period was 02/29/2020. ?  ?Contraception/Family planning: post menopausal status ?Sexually active: Yes ? ?Health Maintenance ?Last Pap: 5 years ago per patient. Results were: Normal ?Last mammogram: 10/10/2020. Results were: Right breast calcifications. Biopsy showed benign fibrous breast tissue with fibroadenomatoid change, sclerosing adenosis and associated microcalcifications ?Last colonoscopy: 11/29/2020. Results were: Polyps, 5-year recall ?Last Dexa: Not indicated ? ?Past medical history, past surgical history, family history and social history were all reviewed and documented in the EPIC chart. 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 2 adopted children - son, somewhat estranged now, daughter is sophomore in [place].  ? ?ROS:  Madeline ROS was performed and pertinent positives and negatives are included. ? ?Exam: ? ?Vitals:  ? 03/28/21 1354  ?BP: 134/86  ?Weight: 123 lb (55.8 kg)  ?Height: 5\' 4"  (1.626 m)  ? ?Body mass index is 21.11 kg/m?. ? ?General appearance:  Normal ?Thyroid:  Symmetrical, normal in size, without palpable masses or nodularity. ?Respiratory ? Auscultation:  Clear without wheezing or rhonchi ?Cardiovascular ? Auscultation:  Regular rate, without rubs, murmurs or gallops ? Edema/varicosities:  Not grossly evident ?Abdominal ? Soft,nontender, without masses, guarding or rebound. ? Liver/spleen:  No organomegaly noted ? Hernia:  None appreciated ? Skin ? Inspection:  Grossly normal ?Breasts: Examined lying and sitting.  ? Right: Without masses,  retractions, nipple discharge or axillary adenopathy. ? ? Left: Without masses, retractions, nipple discharge or axillary adenopathy. ?Genitourinary  ? Inguinal/mons:  Normal without inguinal adenopathy ? External genitalia:  Normal appearing vulva with no masses, tenderness, or lesions ? BUS/Urethra/Skene's glands:  Normal ? Vagina:  Normal appearing with normal color and discharge, no lesions ? Cervix:  Normal appearing without discharge or lesions ? Uterus:  Normal in size, shape and contour.  Midline and mobile, nontender ? Adnexa/parametria:   ?  Rt: Normal in size, without masses or tenderness. ?  Lt: Normal in size, without masses or tenderness. ? Anus and perineum: Normal ? Digital rectal exam: Normal sphincter tone without palpated masses or tenderness ? ?Patient informed chaperone available to be present for breast and pelvic exam. Patient has requested no chaperone to be present. Patient has been advised what will be completed during breast and pelvic exam.  ? ?Assessment/Plan:  51 y.o. G0 to establish care.  ? ?Well female exam with routine gynecological exam - Plan: IGP, Aptima HPV. Education provided on SBEs, importance of preventative screenings, current guidelines, high calcium diet, regular exercise, and multivitamin daily.  Labs with PCP.  ? ?Postmenopausal - no HRT, no bleeding. Had episode of bleeding last year when she started HRT, none since. LMP one year prior to that in 2021.  ? ?Screening for cervical cancer - Normal Pap history. Pap with HR HPV today.  ? ?Screening for breast cancer - Biopsy of right breast 12/2020 showed benign fibrous breast tissue with fibroadenomatoid change, sclerosing adenosis and associated microcalcifications. It is recommended she repeat diagnostic mammogram at 6 months. Normal breast exam today. ? ?Screening for colon cancer - 11/2020 colonoscopy. Will repeat at 5-year interval per GI's recommendation. ? ?  Return in 1 year for annual.  ? ? ? ? ?Tamela Gammon  DNP, 2:15 PM 03/28/2021 ? ?

## 2021-04-01 LAB — IGP, APTIMA HPV
HPV Aptima: NEGATIVE
PAP Smear Comment: 0

## 2021-04-04 ENCOUNTER — Other Ambulatory Visit: Payer: Self-pay | Admitting: Internal Medicine

## 2021-04-04 NOTE — Telephone Encounter (Signed)
Last filled 08-27-20 #20 ?Last OV 12-04-20 ?Next OV 12-05-21 ?[facility] ?

## 2021-08-07 IMAGING — US US ABDOMEN LIMITED
1 series · 14 of 25 positions shown · non-contrast
Comparison: 11/02/2015.

CLINICAL DATA: Fatty liver.  Elevated liver enzymes.

EXAM:
ULTRASOUND ABDOMEN LIMITED RIGHT UPPER QUADRANT

[Series 1: us abdomen limited · 0.17mm/px · 14 of 57 slices shown]
[im 1/57]
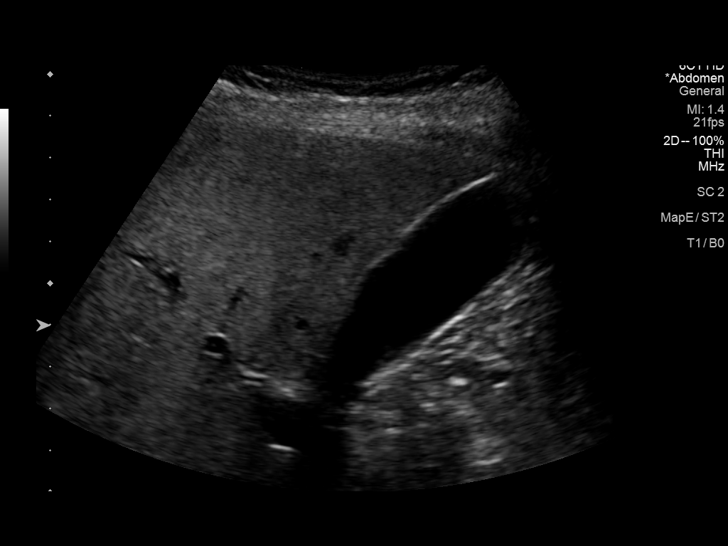
[im 5/57]
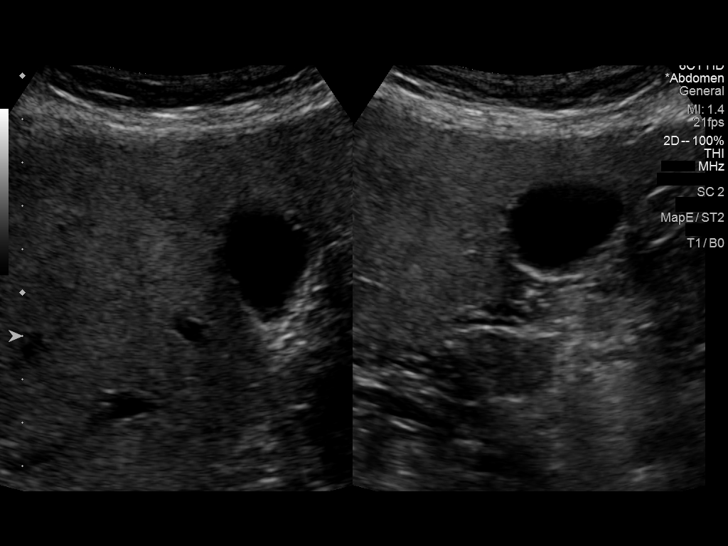
[im 10/57]
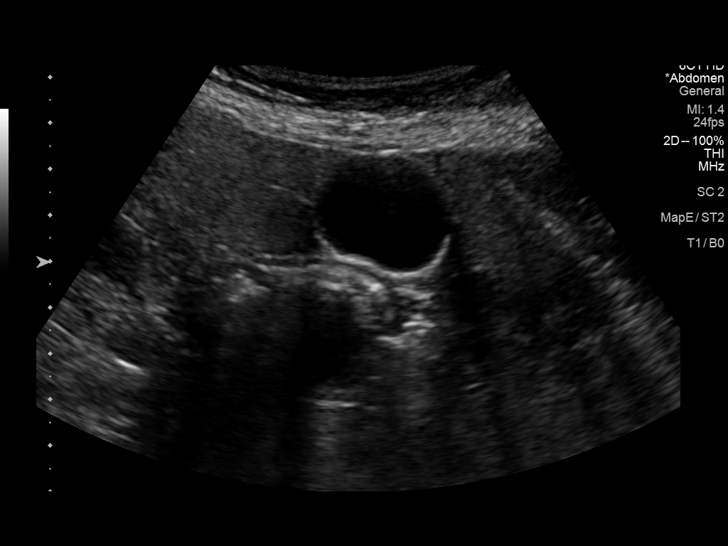
[im 15/57]
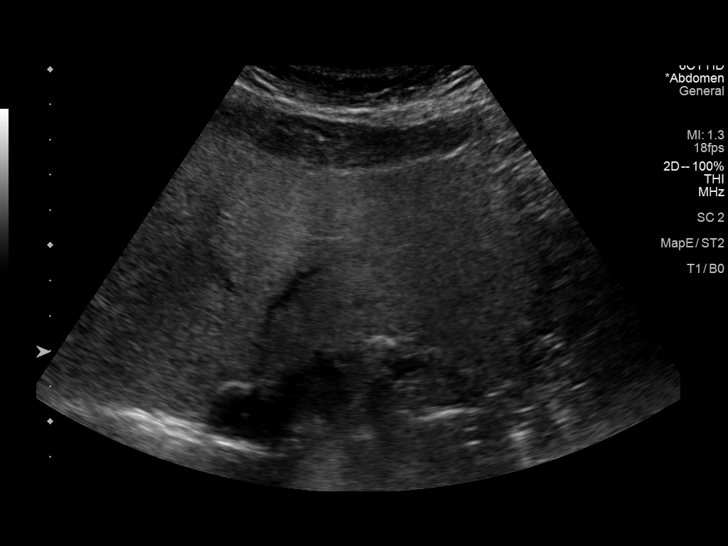
[im 19/57]
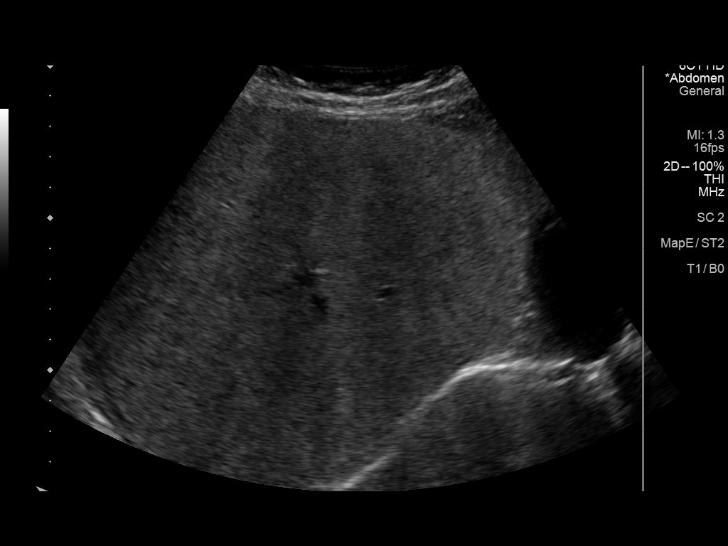
[im 22/57]
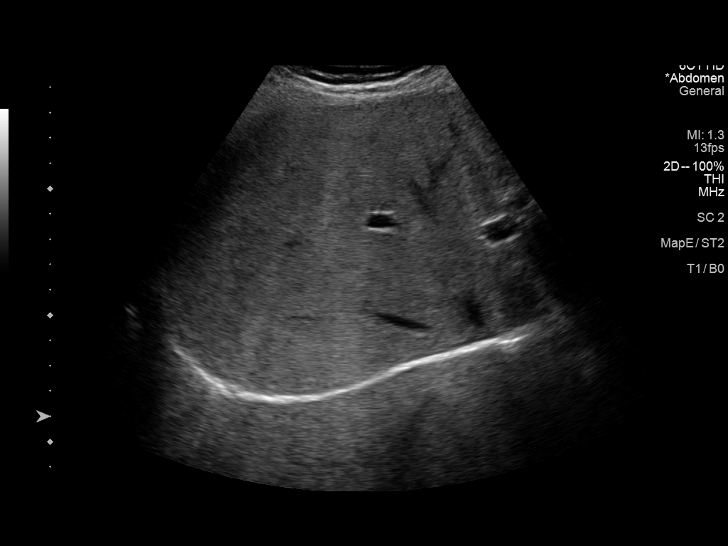
[im 26/57]
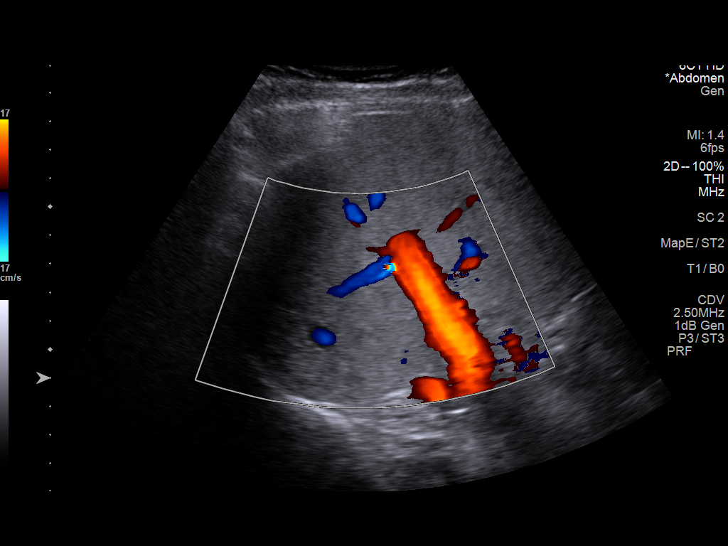
[im 31/57]
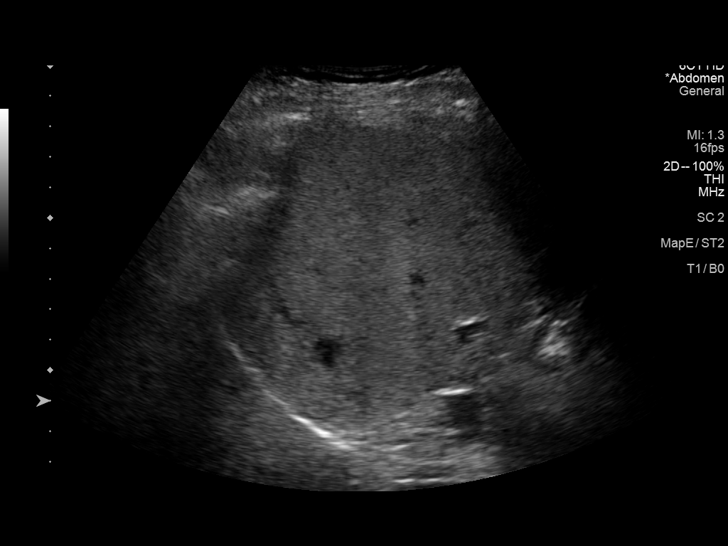
[im 36/57]
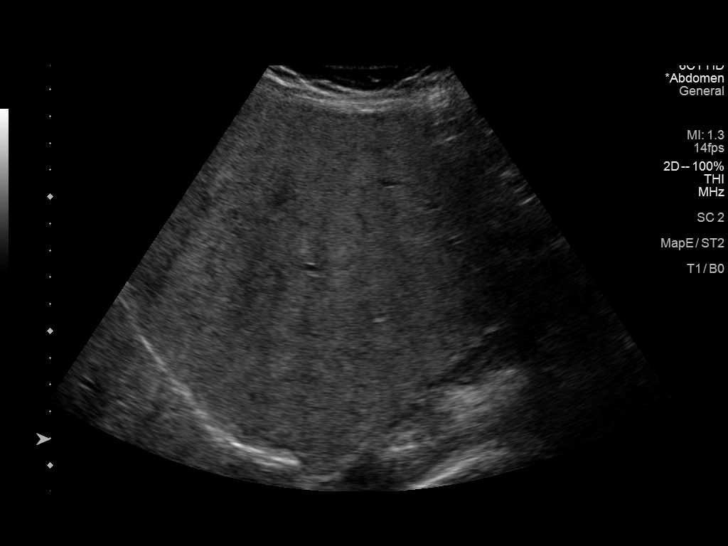
[im 38/57]
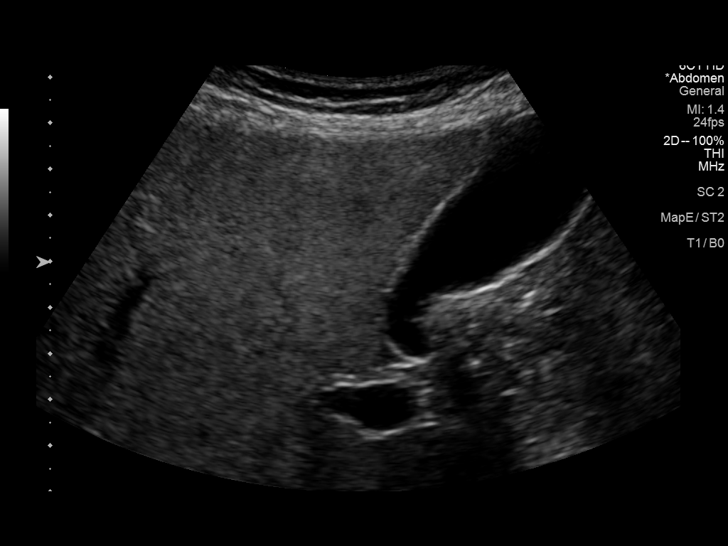
[im 43/57]
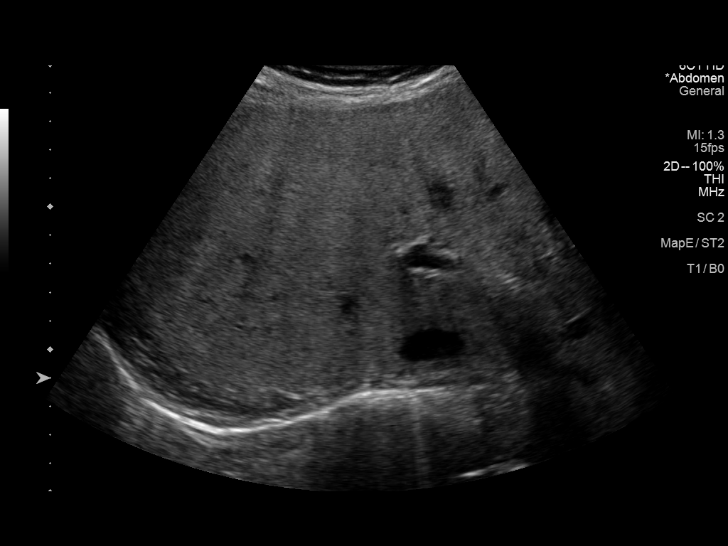
[im 47/57]
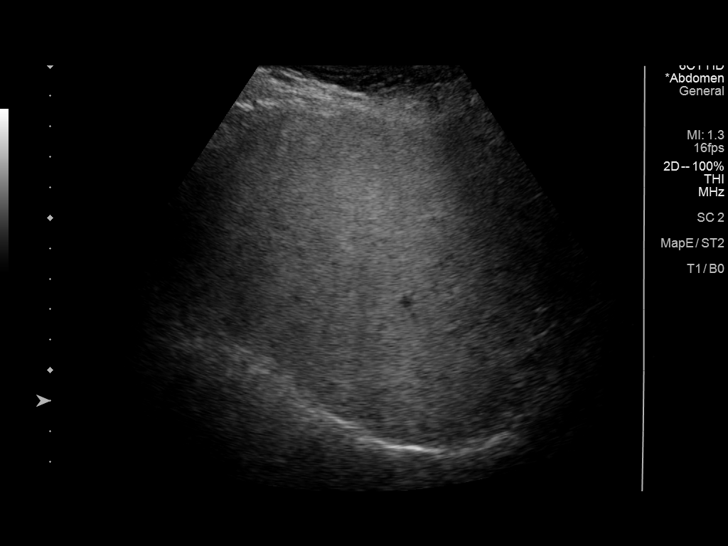
[im 52/57]
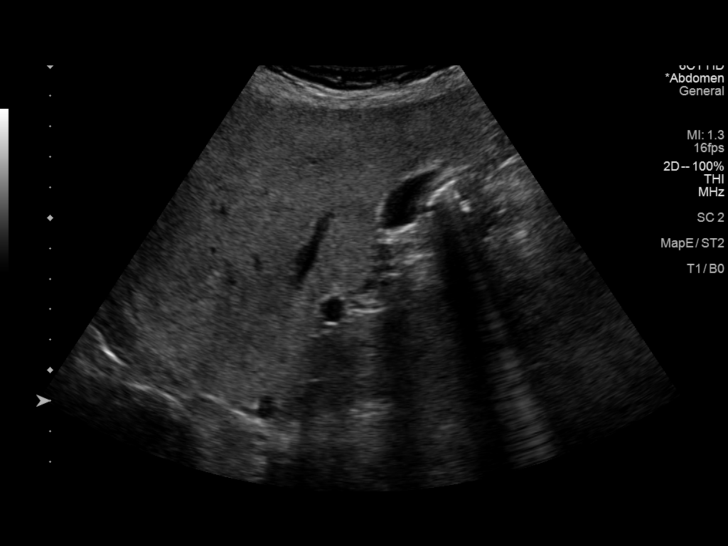
[im 57/57]
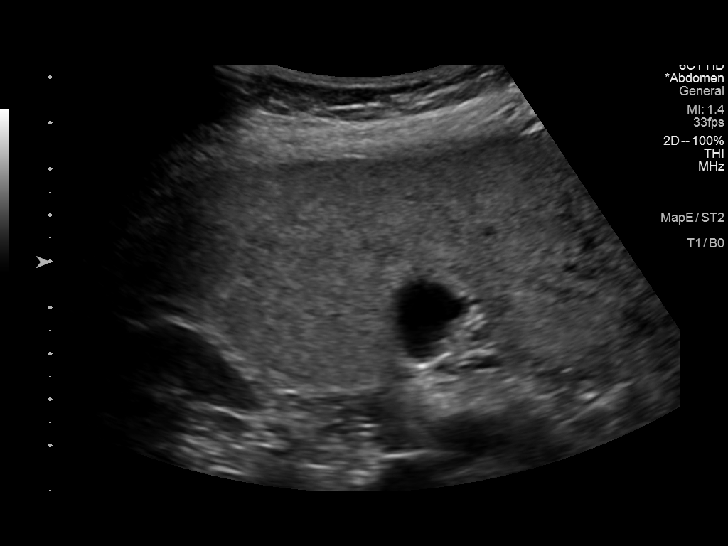

[14 of 25 positions shown; findings below may reference images not displayed]

FINDINGS: Gallbladder:

No gallstones or wall thickening visualized. No sonographic Murphy
sign noted by sonographer.

Common bile duct:

Diameter: 3.2 cm

Liver:

Increased echogenicity liver consistent fatty infiltration again
noted. Similar findings noted on prior exam. Previously identified
isoechoic nodule not identified on today's exam. Portal vein is
patent on color Doppler imaging with normal direction of blood flow
towards the liver.

Other: None.
IMPRESSION: 1. Increased echogenicity of the liver again noted consistent fatty
infiltration. Similar findings noted on prior exam. Previously
identified isoechoic nodule not identified on today's exam.

2.  No gallstones or biliary distention.

## 2021-08-26 LAB — HM MAMMOGRAPHY

## 2021-10-03 ENCOUNTER — Ambulatory Visit: Payer: Managed Care, Other (non HMO) | Admitting: Dermatology

## 2021-10-03 DIAGNOSIS — L719 Rosacea, unspecified: Secondary | ICD-10-CM

## 2021-10-03 DIAGNOSIS — L814 Other melanin hyperpigmentation: Secondary | ICD-10-CM

## 2021-10-03 DIAGNOSIS — D485 Neoplasm of uncertain behavior of skin: Secondary | ICD-10-CM

## 2021-10-03 DIAGNOSIS — D1801 Hemangioma of skin and subcutaneous tissue: Secondary | ICD-10-CM

## 2021-10-03 DIAGNOSIS — D2272 Melanocytic nevi of left lower limb, including hip: Secondary | ICD-10-CM

## 2021-10-03 DIAGNOSIS — D229 Melanocytic nevi, unspecified: Secondary | ICD-10-CM

## 2021-10-03 DIAGNOSIS — L821 Other seborrheic keratosis: Secondary | ICD-10-CM

## 2021-10-03 DIAGNOSIS — L578 Other skin changes due to chronic exposure to nonionizing radiation: Secondary | ICD-10-CM

## 2021-10-03 DIAGNOSIS — L738 Other specified follicular disorders: Secondary | ICD-10-CM | POA: Diagnosis not present

## 2021-10-03 DIAGNOSIS — Z1283 Encounter for screening for malignant neoplasm of skin: Secondary | ICD-10-CM

## 2021-10-03 NOTE — Patient Instructions (Addendum)
Rosacea is a chronic progressive skin condition usually affecting the face of adults, causing redness and/or acne bumps. It is treatable but not curable. It sometimes affects the eyes (ocular rosacea) as well. It may respond to topical and/or systemic medication and can flare with stress, sun exposure, alcohol, exercise and some foods.  Daily application of broad spectrum spf 30+ sunscreen to face is recommended to reduce flares. Discussed the treatment option of BBL/laser.  Typically we recommend 1-3 treatment sessions about 5-8 weeks apart for best results.  The patient's condition may require "maintenance treatments" in the future.  The fee for BBL / laser treatments is $350 per treatment session for the whole face.  A fee can be quoted for other parts of the body. Insurance typically does not pay for BBL/laser treatments and therefore the fee is an out-of-pocket cost.  Wound Care Instructions  Cleanse wound gently with soap and water once a day then pat dry with clean gauze. Apply a thin coat of Petrolatum (petroleum jelly, "Vaseline") over the wound (unless you have an allergy to this). We recommend that you use a new, sterile tube of Vaseline. Do not pick or remove scabs. Do not remove the yellow or white "healing tissue" from the base of the wound.  Cover the wound with fresh, clean, nonstick gauze and secure with paper tape. You may use Band-Aids in place of gauze and tape if the wound is small enough, but would recommend trimming much of the tape off as there is often too much. Sometimes Band-Aids can irritate the skin.  You should call the office for your biopsy report after 1 week if you have not already been contacted.  If you experience any problems, such as abnormal amounts of bleeding, swelling, significant bruising, significant pain, or evidence of infection, please call the office immediately.  FOR ADULT SURGERY PATIENTS: If you need something for pain relief you may take 1 extra  strength Tylenol (acetaminophen) AND 2 Ibuprofen ('200mg'$  each) together every 4 hours as needed for pain. (do not take these if you are allergic to them or if you have a reason you should not take them.) Typically, you may only need pain medication for 1 to 3 days.     Due to recent changes in healthcare laws, you may see results of your pathology and/or laboratory studies on MyChart before the doctors have had a chance to review them. We understand that in some cases there may be results that are confusing or concerning to you. Please understand that not all results are received at the same time and often the doctors may need to interpret multiple results in order to provide you with the best plan of care or course of treatment. Therefore, we ask that you please give [place] 2 business days to thoroughly review all your results before contacting the office for clarification. Should we see a critical lab result, you will be contacted sooner.   If You Need Anything After Your Visit  If you have any questions or concerns for your doctor, please call our main line at [phone] and press option 4 to reach your doctor's medical assistant. If no one answers, please leave a voicemail as directed and we will return your call as soon as possible. Messages left after 4 pm will be answered the following business day.   You may also send [place] a message via [place]. We typically respond to MyChart messages within 1-2 business days.  For prescription refills, please ask your  pharmacy to contact our office. Our fax number is [phone].  If you have an urgent issue when the clinic is closed that cannot wait until the next business day, you can page your doctor at the number below.    Please note that while we do our best to be available for urgent issues outside of office hours, we are not available 24/7.   If you have an urgent issue and are unable to reach [place], you may choose to seek medical care at your doctor's  office, retail clinic, urgent care center, or emergency room.  If you have a medical emergency, please immediately call 911 or go to the emergency department.  Pager Numbers  - Dr. Nehemiah Massed: [phone]  - Dr. Laurence Ferrari: [phone]  - Dr. Nicole Kindred: [phone]  In the event of inclement weather, please call our main line at [phone] for an update on the status of any delays or closures.  Dermatology Medication Tips: Please keep the boxes that topical medications come in in order to help keep track of the instructions about where and how to use these. Pharmacies typically print the medication instructions only on the boxes and not directly on the medication tubes.   If your medication is too expensive, please contact our office at [phone] option 4 or send [place] a message through [place].   We are unable to tell what your co-pay for medications will be in advance as this is different depending on your insurance coverage. However, we may be able to find a substitute medication at lower cost or fill out paperwork to get insurance to cover a needed medication.   If a prior authorization is required to get your medication covered by your insurance company, please allow [place] 1-2 business days to complete this process.  Drug prices often vary depending on where the prescription is filled and some pharmacies may offer cheaper prices.  The website www.goodrx.com contains coupons for medications through different pharmacies. The prices here do not account for what the cost may be with help from insurance (it may be cheaper with your insurance), but the website can give you the price if you did not use any insurance.  - You can print the associated coupon and take it with your prescription to the pharmacy.  - You may also stop by our office during regular business hours and pick up a GoodRx coupon card.  - If you need your prescription sent electronically to a different pharmacy, notify our office  through [facility] or by phone at [phone] option 4.     Si Usted Necesita Algo Despus de Su Visita  Tambin puede enviarnos un mensaje a travs de Pharmacist, community. Por lo general respondemos a los mensajes de MyChart en el transcurso de 1 a 2 das hbiles.  Para renovar recetas, por favor pida a su farmacia que se ponga en contacto con nuestra oficina. Harland Dingwall de fax es [place] [phone].  Si tiene un asunto urgente cuando la clnica est cerrada y que no puede esperar hasta el siguiente da hbil, puede llamar/localizar a su doctor(a) al nmero que aparece a continuacin.   Por favor, tenga en cuenta que aunque hacemos todo lo posible para estar disponibles para asuntos urgentes fuera del horario de [place], no estamos disponibles las 24 horas del da, los 7 das de la [place].   Si tiene un problema urgente y no puede comunicarse con nosotros, puede optar por buscar atencin mdica  en el consultorio de su doctor(a), en  una clnica privada, en un centro de atencin urgente o en una sala de emergencias.  Si tiene Engineering geologist, por favor llame inmediatamente al 911 o vaya a la sala de emergencias.  Nmeros de bper  - Dr. Nehemiah Massed: [phone]  - Dra. Moye: [phone]  - Dra. Nicole Kindred: [phone]  En caso de inclemencias del [place], por favor llame a Johnsie Kindred principal al [phone] para una actualizacin sobre el [place] de cualquier retraso o cierre.  Consejos para la medicacin en dermatologa: Por favor, guarde las cajas en las que vienen los medicamentos de uso tpico para ayudarle a seguir las instrucciones sobre dnde y cmo usarlos. Las farmacias generalmente imprimen las instrucciones del medicamento slo en las cajas y no directamente en los tubos del [place].   Si su medicamento es muy caro, por favor, pngase en contacto con Zigmund Daniel llamando al [phone] y presione la opcin 4 o envenos un mensaje a travs de Pharmacist, community.   No  podemos decirle cul ser su copago por los medicamentos por adelantado ya que esto es diferente dependiendo de la cobertura de su seguro. Sin embargo, es posible que podamos encontrar un medicamento sustituto a Electrical engineer un formulario para que el seguro cubra el medicamento que se considera necesario.   Si se requiere una autorizacin previa para que su compaa de seguros [place] su medicamento, por favor permtanos de 1 a 2 das hbiles para completar este proceso.  Los precios de los medicamentos varan con frecuencia dependiendo del Environmental consultant de dnde se surte la receta y alguna farmacias pueden ofrecer precios ms baratos.  El sitio web www.goodrx.com tiene cupones para medicamentos de Airline pilot. Los precios aqu no tienen en cuenta lo que podra costar con la ayuda del seguro (puede ser ms barato con su seguro), pero el sitio web puede darle el precio si no utiliz Research scientist (physical sciences).  - Puede imprimir el cupn correspondiente y llevarlo con su receta a la farmacia.  - Tambin puede pasar por nuestra oficina durante el horario de atencin regular y Charity fundraiser una tarjeta de cupones de GoodRx.  - Si necesita que su receta se enve electrnicamente a una farmacia diferente, informe a nuestra oficina a travs de MyChart de [place] o por telfono llamando al [phone] y presione la opcin 4.

## 2021-10-03 NOTE — Progress Notes (Unsigned)
Follow-Up Visit   Subjective  Madeline Reid is a 51 y.o. female who presents for the following: Annual Exam (No history of skin cancer or abnormal moles - The patient presents for Total-Body Skin Exam (TBSE) for skin cancer screening and mole check.  The patient has spots, moles and lesions to be evaluated, some may be new or changing and the patient has concerns that these could be cancer./).  The following portions of the chart were reviewed this encounter and updated as appropriate:   Tobacco  Allergies  Meds  Problems  Med Hx  Surg Hx  Fam Hx     Review of Systems:  No other skin or systemic complaints except as noted in HPI or Assessment and Plan.  Objective  Well appearing patient in no apparent distress; mood and affect are within normal limits.  A full examination was performed including scalp, head, eyes, ears, nose, lips, neck, chest, axillae, abdomen, back, buttocks, bilateral upper extremities, bilateral lower extremities, hands, feet, fingers, toes, fingernails, and toenails. All findings within normal limits unless otherwise noted below.  Head - Anterior (Face) Yellow papules  Head - Anterior (Face) Dilated blood vessels  Left medial distal calf 0.6 cm irregular brown macule      Assessment & Plan   Lentigines - Scattered tan macules - Due to sun exposure - Benign-appearing, observe - Recommend daily broad spectrum sunscreen SPF 30+ to sun-exposed areas, reapply every 2 hours as needed. - Call for any changes  Seborrheic Keratoses - Stuck-on, waxy, tan-brown papules and/or plaques  - Benign-appearing - Discussed benign etiology and prognosis. - Observe - Call for any changes  Melanocytic Nevi - Tan-brown and/or pink-flesh-colored symmetric macules and papules - Benign appearing on exam today - Observation - Call clinic for new or changing moles - Recommend daily use of broad spectrum spf 30+ sunscreen to sun-exposed areas.    Hemangiomas - Red papules - Discussed benign nature - Observe - Call for any changes  Actinic Damage - Chronic condition, secondary to cumulative UV/sun exposure - diffuse scaly erythematous macules with underlying dyspigmentation - Recommend daily broad spectrum sunscreen SPF 30+ to sun-exposed areas, reapply every 2 hours as needed.  - Staying in the shade or wearing long sleeves, sun glasses (UVA+UVB protection) and wide brim hats (4-inch brim around the entire circumference of the hat) are also recommended for sun protection.  - Call for new or changing lesions.  Skin cancer screening performed today.  Sebaceous hyperplasia Head - Anterior (Face) Benign-appearing.  Observation.  Call clinic for new or changing lesions.  Recommend daily use of broad spectrum spf 30+ sunscreen to sun-exposed areas.   Rosacea Head - Anterior (Face) Rosacea is a chronic progressive skin condition usually affecting the face of adults, causing redness and/or acne bumps. It is treatable but not curable. It sometimes affects the eyes (ocular rosacea) as well. It may respond to topical and/or systemic medication and can flare with stress, sun exposure, alcohol, exercise and some foods.  Daily application of broad spectrum spf 30+ sunscreen to face is recommended to reduce flares.  Discussed the treatment option of BBL/laser.  Typically we recommend 1-3 treatment sessions about 5-8 weeks apart for best results.  The patient's condition may require "maintenance treatments" in the future.  The fee for BBL / laser treatments is $350 per treatment session for the whole face.  A fee can be quoted for other parts of the body. Insurance typically does not pay for BBL/laser treatments  and therefore the fee is an out-of-pocket cost.  Neoplasm of uncertain behavior of skin Left medial distal calf Epidermal / dermal shaving  Lesion diameter (cm):  0.6 Informed consent: discussed and consent obtained   Timeout:  patient name, date of birth, surgical site, and procedure verified   Procedure prep:  Patient was prepped and draped in usual sterile fashion Prep type:  Isopropyl alcohol Anesthesia: the lesion was anesthetized in a standard fashion   Anesthetic:  1% lidocaine w/ epinephrine 1-100,000 buffered w/ 8.4% NaHCO3 Instrument used: flexible razor blade   Hemostasis achieved with: pressure, aluminum chloride and electrodesiccation   Outcome: patient tolerated procedure well   Post-procedure details: sterile dressing applied and wound care instructions given   Dressing type: bandage and petrolatum    Related Procedures Anatomic Pathology Report  Return in about 1 year (around 10/04/2022) for TBSE.  I, Ashok Cordia, CMA, am acting as scribe for Sarina Ser, MD . Documentation: I have reviewed the above documentation for accuracy and completeness, and I agree with the above.  Sarina Ser, MD

## 2021-10-08 ENCOUNTER — Encounter: Payer: Self-pay | Admitting: Dermatology

## 2021-10-09 ENCOUNTER — Telehealth: Payer: Self-pay

## 2021-10-09 LAB — ANATOMIC PATHOLOGY REPORT

## 2021-10-09 NOTE — Telephone Encounter (Signed)
Left message on voicemail to return my call.  

## 2021-10-09 NOTE — Telephone Encounter (Signed)
-----   Message from Ralene Bathe, MD sent at 10/09/2021  1:09 PM EDT ----- Diagnosis synopsis: Comment  Comment: Specimen 1-Skin Biopsy, distal Left Medial Distal Calf:  LENTIGINOUS COMPOUND MELANOCYTIC NEVUS.  Benign mole No further treatment needed

## 2021-10-16 ENCOUNTER — Telehealth: Payer: Self-pay

## 2021-10-16 NOTE — Telephone Encounter (Signed)
Left pt msg to call for bx results/sh 

## 2021-10-16 NOTE — Telephone Encounter (Signed)
-----   Message from Ralene Bathe, MD sent at 10/09/2021  1:09 PM EDT ----- Diagnosis synopsis: Comment  Comment: Specimen 1-Skin Biopsy, distal Left Medial Distal Calf:  LENTIGINOUS COMPOUND MELANOCYTIC NEVUS.  Benign mole No further treatment needed

## 2021-10-22 ENCOUNTER — Telehealth: Payer: Self-pay

## 2021-10-22 NOTE — Progress Notes (Signed)
Letter sent to [facility] and mailed to patient to advise of bx result/sh

## 2021-10-22 NOTE — Telephone Encounter (Signed)
Left pt msg to call for bx result/sh °

## 2021-10-22 NOTE — Telephone Encounter (Signed)
-----   Message from Ralene Bathe, MD sent at 10/09/2021  1:09 PM EDT ----- Diagnosis synopsis: Comment  Comment: Specimen 1-Skin Biopsy, distal Left Medial Distal Calf:  LENTIGINOUS COMPOUND MELANOCYTIC NEVUS.  Benign mole No further treatment needed

## 2021-11-15 ENCOUNTER — Other Ambulatory Visit: Payer: Self-pay | Admitting: Internal Medicine

## 2021-12-05 ENCOUNTER — Encounter: Payer: Self-pay | Admitting: Internal Medicine

## 2021-12-05 ENCOUNTER — Ambulatory Visit (INDEPENDENT_AMBULATORY_CARE_PROVIDER_SITE_OTHER): Payer: Managed Care, Other (non HMO) | Admitting: Internal Medicine

## 2021-12-05 VITALS — BP 142/90 | HR 79 | Temp 97.3°F | Ht 64.5 in | Wt 126.0 lb

## 2021-12-05 DIAGNOSIS — Z0000 Encounter for general adult medical examination without abnormal findings: Secondary | ICD-10-CM | POA: Diagnosis not present

## 2021-12-05 DIAGNOSIS — I10 Essential (primary) hypertension: Secondary | ICD-10-CM

## 2021-12-05 MED ORDER — LOSARTAN POTASSIUM 50 MG PO TABS: 50.0000 mg | ORAL_TABLET | Freq: Every day | ORAL | 3 refills | Status: DC

## 2021-12-05 NOTE — Assessment & Plan Note (Signed)
BP Readings from Last 3 Encounters:  12/05/21 (!) 142/90  03/28/21 134/86  12/04/20 130/88   Elevated at times at home Will increase losartan to 50

## 2021-12-05 NOTE — Assessment & Plan Note (Signed)
Healthy Exercises regularly Colon due again 2027 Recent mammogram benign Pap at gyn Will get flu vaccine at pharmacy Considering updated COVID and shingrix

## 2021-12-05 NOTE — Progress Notes (Signed)
Subjective:    Patient ID: Madeline Reid, female    DOB: 08-28-1970, 51 y.o.   MRN: [id number]  HPI Here for physical  Doing fine No new concerns Exercising some Still working from home  Checks BP at home some Usually 128/82 in AM---goes up in afternoon (140-150's/90) No problems with the medication  Current Outpatient Medications on File Prior to Visit  Medication Sig Dispense Refill   Black Elderberry (SAMBUCUS ELDERBERRY PO) Take by mouth. 500 mg zinc + vitamin c     EPINEPHrine (AUVI-Q) 0.3 mg/0.3 mL IJ SOAJ injection Inject 0.3 mLs (0.3 mg total) into the muscle as needed. 2 each 2   KRILL OIL PO Take by mouth.     losartan (COZAAR) 25 MG tablet TAKE 1 TABLET BY MOUTH DAILY 90 tablet 0   MILK THISTLE PO Take 7,500 mg by mouth daily. 80% silymarin flavonoids     niacin 500 MG tablet Take by mouth.     Turmeric 500 MG CAPS Take 1,000 mg by mouth daily.     ALPRAZolam (XANAX) 0.25 MG tablet TAKE 1 TABLET BY MOUTH TWICE A DAY AS NEEDED FOR ANXIETY (Patient not taking: Reported on 12/05/2021) 20 tablet 1   [DISCONTINUED] omeprazole (PRILOSEC) 20 MG capsule Take 20 mg by mouth daily.       No current facility-administered medications on file prior to visit.    Allergies  Allergen Reactions   Lisinopril     Potential cough    Past Medical History:  Diagnosis Date   Atopic dermatitis versus contact dermatitis 12/20/2018   Endometriosis    GERD (gastroesophageal reflux disease)    Hypertension    Urticaria     Past Surgical History:  Procedure Laterality Date   BREAST BIOPSY Right    Benign   COLONOSCOPY WITH PROPOFOL N/A 11/29/2020   Procedure: COLONOSCOPY WITH PROPOFOL;  Surgeon: Lin Landsman, MD;  Location: [facility] ENDOSCOPY;  Service: Gastroenterology;  Laterality: N/A;   LAPAROSCOPY  1992   endometriosis   MYOMECTOMY  2007   removed    Family History  Problem Relation Age of Onset   Eczema Mother    Cancer Paternal Grandfather        throat  cancer   Coronary artery disease Maternal Grandmother    Coronary artery disease Paternal Grandmother    Eczema Maternal Grandfather    Colon cancer Neg Hx    Breast cancer Neg Hx     Social History   Socioeconomic History   Marital status: Married    Spouse name: Not on file   Number of children: 2   Years of education: Not on file   Highest education level: Not on file  Occupational History   Occupation: revenue Printmaker: [employer]  Tobacco Use   Smoking status: Former    Types: Cigarettes    Quit date: 08/28/2006    Years since quitting: 15.2   Smokeless tobacco: Never  Vaping Use   Vaping Use: Never used  Substance and Sexual Activity   Alcohol use: Yes    Comment: occ   Drug use: No   Sexual activity: Yes    Birth control/protection: Post-menopausal  Other Topics Concern   Not on file  Social History Narrative   Adopted 2 children in 2012            Social Determinants of Health   Financial Resource Strain: Not on file  Food Insecurity: Not on file  Transportation Needs: Not on file  Physical Activity: Not on file  Stress: Not on file  Social Connections: Not on file  Intimate Partner Violence: Not on file   Review of Systems  Constitutional:  Negative for fatigue and unexpected weight change.       Wears seat belt  HENT:  Negative for dental problem.        Mild ringing---hearing okay Keeps up with dentist  Eyes:  Negative for visual disturbance.       No diplopia or unilateral vision loss  Respiratory:  Negative for cough, chest tightness and shortness of breath.   Cardiovascular:  Negative for chest pain and leg swelling.       Occ racing heart--if "worked up"  Gastrointestinal:  Negative for blood in stool and constipation.       No heartburn  Endocrine: Negative for polydipsia and polyuria.  Genitourinary:  Negative for dyspareunia.       Had recent UTI---treated virtually through [facility]  Musculoskeletal:  Negative for arthralgias,  back pain and joint swelling.  Skin:  Negative for rash.  Allergic/Immunologic: Negative for environmental allergies and immunocompromised state.  Neurological:  Negative for dizziness, syncope, light-headedness and headaches.  Hematological:  Negative for adenopathy. Does not bruise/bleed easily.  Psychiatric/Behavioral:  Negative for dysphoric mood and sleep disturbance.        Anxiety at times--hasn't needed the xanax lately       Objective:   Physical Exam Constitutional:      Appearance: Normal appearance.  HENT:     Mouth/Throat:     Pharynx: No oropharyngeal exudate or posterior oropharyngeal erythema.  Eyes:     Conjunctiva/sclera: Conjunctivae normal.     Pupils: Pupils are equal, round, and reactive to light.  Cardiovascular:     Rate and Rhythm: Normal rate and regular rhythm.     Pulses: Normal pulses.     Heart sounds: No murmur heard.    No gallop.  Pulmonary:     Effort: Pulmonary effort is normal.     Breath sounds: Normal breath sounds. No wheezing or rales.  Abdominal:     Palpations: Abdomen is soft.     Tenderness: There is no abdominal tenderness.  Musculoskeletal:     Cervical back: Neck supple.     Right lower leg: No edema.     Left lower leg: No edema.  Lymphadenopathy:     Cervical: No cervical adenopathy.  Skin:    Findings: No lesion or rash.  Neurological:     General: No focal deficit present.     Mental Status: She is alert and oriented to person, place, and time.  Psychiatric:        Mood and Affect: Mood normal.        Behavior: Behavior normal.            Assessment & Plan:

## 2021-12-06 LAB — COMPREHENSIVE METABOLIC PANEL
ALT: 111 IU/L — ABNORMAL HIGH (ref 0–32)
AST: 92 IU/L — ABNORMAL HIGH (ref 0–40)
Albumin/Globulin Ratio: 2 (ref 1.2–2.2)
Albumin: 4.9 g/dL (ref 3.8–4.9)
Alkaline Phosphatase: 66 IU/L (ref 44–121)
BUN/Creatinine Ratio: 17 (ref 9–23)
BUN: 10 mg/dL (ref 6–24)
Bilirubin Total: 0.5 mg/dL (ref 0.0–1.2)
CO2: 19 mmol/L — ABNORMAL LOW (ref 20–29)
Calcium: 9.7 mg/dL (ref 8.7–10.2)
Chloride: 94 mmol/L — ABNORMAL LOW (ref 96–106)
Creatinine, Ser: 0.58 mg/dL (ref 0.57–1.00)
Globulin, Total: 2.5 g/dL (ref 1.5–4.5)
Glucose: 74 mg/dL (ref 70–99)
Potassium: 3.9 mmol/L (ref 3.5–5.2)
Sodium: 137 mmol/L (ref 134–144)
Total Protein: 7.4 g/dL (ref 6.0–8.5)
eGFR: 108 mL/min/{1.73_m2} (ref >59)

## 2021-12-06 LAB — LIPID PANEL
Chol/HDL Ratio: 1.7 ratio (ref 0.0–4.4)
Cholesterol, Total: 234 mg/dL — ABNORMAL HIGH (ref 100–199)
HDL: 139 mg/dL (ref >39)
LDL Chol Calc (NIH): 83 mg/dL (ref 0–99)
Triglycerides: 75 mg/dL (ref 0–149)
VLDL Cholesterol Cal: 12 mg/dL (ref 5–40)

## 2021-12-06 LAB — CBC
Hematocrit: 38 % (ref 34.0–46.6)
Hemoglobin: 13.1 g/dL (ref 11.1–15.9)
MCH: 33.1 pg — ABNORMAL HIGH (ref 26.6–33.0)
MCHC: 34.5 g/dL (ref 31.5–35.7)
MCV: 96 fL (ref 79–97)
Platelets: 268 10*3/uL (ref 150–450)
RBC: 3.96 x10E6/uL (ref 3.77–5.28)
RDW: 13.4 % (ref 11.7–15.4)
WBC: 5.2 10*3/uL (ref 3.4–10.8)

## 2021-12-06 LAB — TSH: TSH: 1.76 u[IU]/mL (ref 0.450–4.500)

## 2022-01-23 ENCOUNTER — Other Ambulatory Visit: Payer: Self-pay | Admitting: Internal Medicine

## 2022-01-27 ENCOUNTER — Encounter: Payer: Self-pay | Admitting: Internal Medicine

## 2022-01-28 MED ORDER — LOSARTAN POTASSIUM 50 MG PO TABS: 50.0000 mg | ORAL_TABLET | Freq: Every day | ORAL | 3 refills | Status: DC

## 2022-01-28 NOTE — Addendum Note (Signed)
Addended by: Pilar Grammes on: 01/28/2022 04:08 PM   Modules accepted: Orders

## 2022-03-31 ENCOUNTER — Ambulatory Visit: Payer: Managed Care, Other (non HMO) | Admitting: Nurse Practitioner

## 2022-10-29 ENCOUNTER — Ambulatory Visit: Payer: Managed Care, Other (non HMO) | Admitting: Dermatology

## 2022-10-29 ENCOUNTER — Encounter: Payer: Self-pay | Admitting: Dermatology

## 2022-10-29 VITALS — BP 144/78 | HR 99

## 2022-10-29 DIAGNOSIS — Z7189 Other specified counseling: Secondary | ICD-10-CM

## 2022-10-29 DIAGNOSIS — W908XXA Exposure to other nonionizing radiation, initial encounter: Secondary | ICD-10-CM

## 2022-10-29 DIAGNOSIS — L814 Other melanin hyperpigmentation: Secondary | ICD-10-CM

## 2022-10-29 DIAGNOSIS — D485 Neoplasm of uncertain behavior of skin: Secondary | ICD-10-CM

## 2022-10-29 DIAGNOSIS — L578 Other skin changes due to chronic exposure to nonionizing radiation: Secondary | ICD-10-CM | POA: Diagnosis not present

## 2022-10-29 DIAGNOSIS — D1801 Hemangioma of skin and subcutaneous tissue: Secondary | ICD-10-CM

## 2022-10-29 DIAGNOSIS — Z1283 Encounter for screening for malignant neoplasm of skin: Secondary | ICD-10-CM | POA: Diagnosis not present

## 2022-10-29 DIAGNOSIS — I781 Nevus, non-neoplastic: Secondary | ICD-10-CM

## 2022-10-29 DIAGNOSIS — L719 Rosacea, unspecified: Secondary | ICD-10-CM

## 2022-10-29 DIAGNOSIS — D492 Neoplasm of unspecified behavior of bone, soft tissue, and skin: Secondary | ICD-10-CM

## 2022-10-29 DIAGNOSIS — D225 Melanocytic nevi of trunk: Secondary | ICD-10-CM | POA: Diagnosis not present

## 2022-10-29 DIAGNOSIS — D229 Melanocytic nevi, unspecified: Secondary | ICD-10-CM

## 2022-10-29 DIAGNOSIS — L821 Other seborrheic keratosis: Secondary | ICD-10-CM

## 2022-10-29 NOTE — Progress Notes (Signed)
Follow-Up Visit   Subjective  Madeline Reid is a 52 y.o. female who presents for the following: Skin Cancer Screening and Full Body Skin Exam  The patient presents for Total-Body Skin Exam (TBSE) for skin cancer screening and mole check. The patient has spots, moles and lesions to be evaluated, some may be new or changing and the patient may have concern these could be cancer.  The following portions of the chart were reviewed this encounter and updated as appropriate: medications, allergies, medical history  Review of Systems:  No other skin or systemic complaints except as noted in HPI or Assessment and Plan.  Objective  Well appearing patient in no apparent distress; mood and affect are within normal limits.  A full examination was performed including scalp, head, eyes, ears, nose, lips, neck, chest, axillae, abdomen, back, buttocks, bilateral upper extremities, bilateral lower extremities, hands, feet, fingers, toes, fingernails, and toenails. All findings within normal limits unless otherwise noted below.   Relevant physical exam findings are noted in the Assessment and Plan. R post distal deltoid Irregular brown macule 0.6 cm    Assessment & Plan   SKIN CANCER SCREENING PERFORMED TODAY.  ACTINIC DAMAGE - Chronic condition, secondary to cumulative UV/sun exposure - diffuse scaly erythematous macules with underlying dyspigmentation - Recommend daily broad spectrum sunscreen SPF 30+ to sun-exposed areas, reapply every 2 hours as needed.  - Staying in the shade or wearing long sleeves, sun glasses (UVA+UVB protection) and wide brim hats (4-inch brim around the entire circumference of the hat) are also recommended for sun protection.  - Call for new or changing lesions.  LENTIGINES, SEBORRHEIC KERATOSES, HEMANGIOMAS - Benign normal skin lesions - Benign-appearing - Call for any changes  MELANOCYTIC NEVI - Tan-brown and/or pink-flesh-colored symmetric macules and  papules - Benign appearing on exam today - Observation - Call clinic for new or changing moles - Recommend daily use of broad spectrum spf 30+ sunscreen to sun-exposed areas.   ROSACEA Exam Mid face erythema with telangiectasias.  Rosacea is a chronic progressive skin condition usually affecting the face of adults, causing redness and/or acne bumps. It is treatable but not curable. It sometimes affects the eyes (ocular rosacea) as well. It may respond to topical and/or systemic medication and can flare with stress, sun exposure, alcohol, exercise, topical steroids (including hydrocortisone/cortisone 10) and some foods.  Daily application of broad spectrum spf 30+ sunscreen to face is recommended to reduce flares.  Treatment Plan Counseling for BBL / IPL / Laser and Coordination of Care Discussed the treatment option of Broad Band Light (BBL) /Intense Pulsed Light (IPL)/ Laser for skin discoloration, including brown spots and redness.  Typically we recommend at least 1-3 treatment sessions about 5-8 weeks apart for best results.  Cannot have tanned skin when BBL performed, and regular use of sunscreen/photoprotection is advised after the procedure to help maintain results. The patient's condition may also require "maintenance treatments" in the future.  The fee for BBL / laser treatments is $350 per treatment session for the whole face.  A fee can be quoted for other parts of the body.  Insurance typically does not pay for BBL/laser treatments and therefore the fee is an out-of-pocket cost. Recommend prophylactic valtrex treatment. Once scheduled for procedure, will send Rx in prior to patient's appointment.   Neoplasm of uncertain behavior of skin R post distal deltoid  Epidermal / dermal shaving  Lesion diameter (cm):  0.6 Informed consent: discussed and consent obtained   Timeout:  patient name, date of birth, surgical site, and procedure verified   Procedure prep:  Patient was prepped and  draped in usual sterile fashion Prep type:  Isopropyl alcohol Anesthesia: the lesion was anesthetized in a standard fashion   Anesthetic:  1% lidocaine w/ epinephrine 1-100,000 buffered w/ 8.4% NaHCO3 Instrument used: flexible razor blade   Hemostasis achieved with: pressure, aluminum chloride and electrodesiccation   Outcome: patient tolerated procedure well   Post-procedure details: sterile dressing applied and wound care instructions given   Dressing type: bandage and petrolatum    Anatomic Pathology Report  Skin cancer screening  Actinic skin damage  Rosacea  Counseling and coordination of care  Lentigo  Melanocytic nevus, unspecified location  Telangiectasia   Return in about 1 year (around 10/29/2023) for TBSE.  Madeline Reid, CMA, am acting as scribe for Madeline Sans, MD .  Documentation: I have reviewed the above documentation for accuracy and completeness, and I agree with the above.  Madeline Sans, MD

## 2022-10-29 NOTE — Patient Instructions (Addendum)

## 2022-11-06 ENCOUNTER — Ambulatory Visit: Payer: Managed Care, Other (non HMO) | Admitting: Dermatology

## 2022-11-06 LAB — ANATOMIC PATHOLOGY REPORT

## 2022-11-10 ENCOUNTER — Telehealth: Payer: Self-pay

## 2022-11-10 DIAGNOSIS — D239 Other benign neoplasm of skin, unspecified: Secondary | ICD-10-CM

## 2022-11-10 HISTORY — DX: Other benign neoplasm of skin, unspecified: D23.9

## 2022-11-10 NOTE — Telephone Encounter (Signed)
Patient informed of pathology results via MyChart and had no additional questions or concerns. Follow up appointment scheduled for recheck.

## 2022-11-10 NOTE — Telephone Encounter (Signed)
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 SEE COMMENTS.  Severe dysplastic Pathologist comments that margins clear May need additional procedure Schedule appt for 6 mos for re-check

## 2022-11-24 ENCOUNTER — Ambulatory Visit (INDEPENDENT_AMBULATORY_CARE_PROVIDER_SITE_OTHER): Payer: Self-pay | Admitting: Dermatology

## 2022-11-24 DIAGNOSIS — L814 Other melanin hyperpigmentation: Secondary | ICD-10-CM

## 2022-11-24 DIAGNOSIS — I781 Nevus, non-neoplastic: Secondary | ICD-10-CM

## 2022-11-24 NOTE — Patient Instructions (Signed)
Counseling for BBL / IPL / Laser and Coordination of Care Discussed the treatment option of Broad Band Light (BBL) /Intense Pulsed Light (IPL)/ Laser for skin discoloration, including brown spots and redness.  Typically we recommend at least 1-3 treatment sessions about 5-8 weeks apart for best results.  Cannot have tanned skin when BBL performed, and regular use of sunscreen/photoprotection is advised after the procedure to help maintain results. The patient's condition may also require "maintenance treatments" in the future.  The fee for BBL / laser treatments is $350 per treatment session for the whole face.  A fee can be quoted for other parts of the body.  Insurance typically does not pay for BBL/laser treatments and therefore the fee is an out-of-pocket cost. Recommend prophylactic valtrex treatment. Once scheduled for procedure, will send Rx in prior to patient's appointment.     Due to recent changes in healthcare laws, you may see results of your pathology and/or laboratory studies on MyChart before the doctors have had a chance to review them. We understand that in some cases there may be results that are confusing or concerning to you. Please understand that not all results are received at the same time and often the doctors may need to interpret multiple results in order to provide you with the best plan of care or course of treatment. Therefore, we ask that you please give [place] 2 business days to thoroughly review all your results before contacting the office for clarification. Should we see a critical lab result, you will be contacted sooner.   If You Need Anything After Your Visit  If you have any questions or concerns for your doctor, please call our main line at [phone] and press option 4 to reach your doctor's medical assistant. If no one answers, please leave a voicemail as directed and we will return your call as soon as possible. Messages left after 4 pm will be answered the  following business day.   You may also send [place] a message via MyChart. We typically respond to MyChart messages within 1-2 business days.  For prescription refills, please ask your pharmacy to contact our office. Our fax number is [phone].  If you have an urgent issue when the clinic is closed that cannot wait until the next business day, you can page your doctor at the number below.    Please note that while we do our best to be available for urgent issues outside of office hours, we are not available 24/7.   If you have an urgent issue and are unable to reach [place], you may choose to seek medical care at your doctor's office, retail clinic, urgent care center, or emergency room.  If you have a medical emergency, please immediately call 911 or go to the emergency department.  Pager Numbers  - Dr. Gwen Pounds: [phone]  - Dr. Roseanne Reno: [phone]  - Dr. Katrinka Blazing: [phone]   In the event of inclement weather, please call our main line at [phone] for an update on the status of any delays or closures.  Dermatology Medication Tips: Please keep the boxes that topical medications come in in order to help keep track of the instructions about where and how to use these. Pharmacies typically print the medication instructions only on the boxes and not directly on the medication tubes.   If your medication is too expensive, please contact our office at [phone] option 4 or send [place] a message through MyChart.   We are unable to tell what  your co-pay for medications will be in advance as this is different depending on your insurance coverage. However, we may be able to find a substitute medication at lower cost or fill out paperwork to get insurance to cover a needed medication.   If a prior authorization is required to get your medication covered by your insurance company, please allow [place] 1-2 business days to complete this process.  Drug prices often vary depending on where the  prescription is filled and some pharmacies may offer cheaper prices.  The website www.goodrx.com contains coupons for medications through different pharmacies. The prices here do not account for what the cost may be with help from insurance (it may be cheaper with your insurance), but the website can give you the price if you did not use any insurance.  - You can print the associated coupon and take it with your prescription to the pharmacy.  - You may also stop by our office during regular business hours and pick up a GoodRx coupon card.  - If you need your prescription sent electronically to a different pharmacy, notify our office through [facility] or by phone at [phone] option 4.     Si Usted Necesita Algo Despus de Su Visita  Tambin puede enviarnos un mensaje a travs de Clinical cytogeneticist. Por lo general respondemos a los mensajes de MyChart en el transcurso de 1 a 2 das hbiles.  Para renovar recetas, por favor pida a su farmacia que se ponga en contacto con nuestra oficina. Annie Sable de fax es [place] [phone].  Si tiene un asunto urgente cuando la clnica est cerrada y que no puede esperar hasta el siguiente da hbil, puede llamar/localizar a su doctor(a) al nmero que aparece a continuacin.   Por favor, tenga en cuenta que aunque hacemos todo lo posible para estar disponibles para asuntos urgentes fuera del horario de [place], no estamos disponibles las 24 horas del da, los 7 [address]  [address] de la [place].   Si tiene un problema urgente y no puede comunicarse con nosotros, puede optar por buscar atencin mdica  en el consultorio de su doctor(a), en una clnica privada, en un centro de atencin urgente o en una sala de emergencias.  Si tiene Engineer, drilling, por favor llame inmediatamente al 911 o vaya a la sala de emergencias.  Nmeros de bper  - Dr. Gwen Pounds: [phone]  - Dra. Roseanne Reno: [phone]  - Dr. Katrinka Blazing: [phone]   En caso de inclemencias del  tiempo, por favor llame a Lacy Duverney principal al [phone] para una actualizacin sobre el [place] de cualquier retraso o cierre.  Consejos para la medicacin en dermatologa: Por favor, guarde las cajas en las que vienen los medicamentos de uso tpico para ayudarle a seguir las instrucciones sobre dnde y cmo usarlos. Las farmacias generalmente imprimen las instrucciones del medicamento slo en las cajas y no directamente en los tubos del [place].   Si su medicamento es muy caro, por favor, pngase en contacto con Rolm Gala llamando al [phone] y presione la opcin 4 o envenos un mensaje a travs de Clinical cytogeneticist.   No podemos decirle cul ser su copago por los medicamentos por adelantado ya que esto es diferente dependiendo de la cobertura de su seguro. Sin embargo, es posible que podamos encontrar un medicamento sustituto a Audiological scientist un formulario para que el seguro cubra el medicamento que se considera necesario.   Si se requiere una autorizacin previa para que su compaa de seguros [place] su  medicamento, por favor permtanos de 1 a 2 das hbiles para completar [address]  Los precios de los medicamentos varan con frecuencia dependiendo del Environmental consultant de dnde se surte la receta y alguna farmacias pueden ofrecer precios ms baratos.  El sitio web www.goodrx.com tiene cupones para medicamentos de Health and safety inspector. Los precios aqu no tienen en cuenta lo que podra costar con la ayuda del seguro (puede ser ms barato con su seguro), pero el sitio web puede darle el precio si no utiliz Tourist information centre manager.  - Puede imprimir el cupn correspondiente y llevarlo con su receta a la farmacia.  - Tambin puede pasar por nuestra oficina durante el horario de atencin regular y Education officer, museum una tarjeta de cupones de GoodRx.  - Si necesita que su receta se enve electrnicamente a una farmacia diferente, informe a nuestra oficina a travs de MyChart de [place] o por telfono  llamando al [phone] y presione la opcin 4.

## 2022-11-24 NOTE — Progress Notes (Signed)
   Follow-Up Visit   Subjective  Madeline Reid is a 52 y.o. female who presents for the following: BBL treatment for telangiectasias and lentigos, first treatment.    The following portions of the chart were reviewed this encounter and updated as appropriate: medications, allergies, medical history  Review of Systems:  No other skin or systemic complaints except as noted in HPI or Assessment and Plan.  Objective  Well appearing patient in no apparent distress; mood and affect are within normal limits.  A focused examination was performed of the following areas: Face  Relevant physical exam findings are noted in the Assessment and Plan.  face Dilated blood vessels on the cheeks and nose  face Scattered tan macules.                   Assessment & Plan   Telangiectasias face  Patient was given Laser Kit and Iced Gel Face Mask today.  Photorejuvenation - face Prior to the procedure, the patient's past medical history, medications, allergies, and the rare but potential risks and complications were reviewed with the patient and a signed consent was obtained.  Pre and post treatment care was discussed and instructions provided.   [place] BBL - 11/24/22 1600      Patient Details   Skin Type: I    Anesthestic Cream Applied: No    Photo Takes: Yes    Consent Signed: Yes      Treatment Details   Date: 11/24/22    Treatment #: 1    Area: face    Filter: 1st Pass;2nd Pass      1st Pass   Location: F    Device: 560 Filter    BBL j/cm2: 27    PW Msec Sec: 27    Cooling Temp: 20    Pulses: 83    7mm: This crystal used.      Patient tolerated the procedure well.   Wynelle Link avoidance was stressed. The patient will call with any problems, questions or concerns prior to their next appointment.    Lentigo face  Photorejuvenation - face Prior to the procedure, the patient's past medical history, medications, allergies, and the rare but potential risks and  complications were reviewed with the patient and a signed consent was obtained.  Pre and post treatment care was discussed and instructions provided.   [place] BBL - 11/24/22 1600      Patient Details   Skin Type: I    Anesthestic Cream Applied: No    Photo Takes: Yes    Consent Signed: Yes      Treatment Details   Date: 11/24/22    Treatment #: 1    Area: face    Filter: 1st Pass;2nd Pass       2nd Pass   Location: F    Device: 515 Filter    BBL j/cm2: 12    PW Msec Sec: 10    Cooling Temp: 25    Pulses: 75    15x15: This crystal used.     Patient tolerated the procedure well.   Wynelle Link avoidance was stressed. The patient will call with any problems, questions or concerns prior to their next appointment.     Return as scheduled.  Wendee Beavers, CMA, am acting as scribe for Armida Sans, MD .   Documentation: I have reviewed the above documentation for accuracy and completeness, and I agree with the above.  Armida Sans, MD

## 2022-12-06 ENCOUNTER — Encounter: Payer: Self-pay | Admitting: Dermatology

## 2022-12-10 ENCOUNTER — Encounter: Payer: Self-pay | Admitting: Internal Medicine

## 2022-12-10 ENCOUNTER — Ambulatory Visit: Payer: Managed Care, Other (non HMO) | Admitting: Internal Medicine

## 2022-12-10 VITALS — BP 122/88 | HR 88 | Temp 98.7°F | Ht 64.5 in | Wt 141.0 lb

## 2022-12-10 DIAGNOSIS — Z0000 Encounter for general adult medical examination without abnormal findings: Secondary | ICD-10-CM | POA: Diagnosis not present

## 2022-12-10 DIAGNOSIS — M19041 Primary osteoarthritis, right hand: Secondary | ICD-10-CM

## 2022-12-10 DIAGNOSIS — I10 Essential (primary) hypertension: Secondary | ICD-10-CM | POA: Diagnosis not present

## 2022-12-10 DIAGNOSIS — E785 Hyperlipidemia, unspecified: Secondary | ICD-10-CM | POA: Diagnosis not present

## 2022-12-10 MED ORDER — LOSARTAN POTASSIUM 50 MG PO TABS: 50.0000 mg | ORAL_TABLET | Freq: Every day | ORAL | 3 refills | Status: DC

## 2022-12-10 NOTE — Assessment & Plan Note (Signed)
Healthy Does exercise Flu vaccine at work Will consider shingrix and COVID Colon due 2027 Gets paps and mammograms through gyn

## 2022-12-10 NOTE — Assessment & Plan Note (Signed)
Trial diclofenac Consider hand referral if persists

## 2022-12-10 NOTE — Addendum Note (Signed)
Addended by: Alvina Chou on: 12/10/2022 12:27 PM   Modules accepted: Orders

## 2022-12-10 NOTE — Patient Instructions (Signed)
Try over the counter diclofenac gel on your finger.

## 2022-12-10 NOTE — Assessment & Plan Note (Signed)
Low LDL though No action

## 2022-12-10 NOTE — Assessment & Plan Note (Signed)
BP Readings from Last 3 Encounters:  12/10/22 122/88  10/29/22 (!) 144/78  12/05/21 (!) 142/90   Controlled on the losartan 50 Will check labs

## 2022-12-10 NOTE — Progress Notes (Signed)
Subjective:    Patient ID: Madeline Reid, female    DOB: 1970/02/24, 52 y.o.   MRN: [id number]  HPI Here for physical  Only issue is joint pains---but especially right thumb DIP Hard time using mouse Swells at times but can move it normally Lidocaine didn't help  Checks BP at times Better since the higher losartan dose  Some exercise--walking, has gym membership (not using though) Did gain some weight--needs to be careful with carbs (had been on keto before)  Current Outpatient Medications on File Prior to Visit  Medication Sig Dispense Refill   ALPRAZolam (XANAX) 0.25 MG tablet TAKE 1 TABLET BY MOUTH TWICE A DAY AS NEEDED FOR ANXIETY 20 tablet 1   Black Elderberry (SAMBUCUS ELDERBERRY PO) Take by mouth. 500 mg zinc + vitamin c     EPINEPHrine (AUVI-Q) 0.3 mg/0.3 mL IJ SOAJ injection Inject 0.3 mLs (0.3 mg total) into the muscle as needed. 2 each 2   KRILL OIL PO Take by mouth.     losartan (COZAAR) 50 MG tablet Take 1 tablet (50 mg total) by mouth daily. 90 tablet 3   MILK THISTLE PO Take 7,500 mg by mouth daily. 80% silymarin flavonoids     niacin 500 MG tablet Take by mouth.     Turmeric 500 MG CAPS Take 1,000 mg by mouth daily.     [DISCONTINUED] omeprazole (PRILOSEC) 20 MG capsule Take 20 mg by mouth daily.       No current facility-administered medications on file prior to visit.    Allergies  Allergen Reactions   Lisinopril     Potential cough    Past Medical History:  Diagnosis Date   Atopic dermatitis versus contact dermatitis 12/20/2018   Dysplastic nevus 11/10/2022   R post distal deltoid - severe, margins free, removed by shave bx   Endometriosis    GERD (gastroesophageal reflux disease)    Hypertension    Urticaria     Past Surgical History:  Procedure Laterality Date   BREAST BIOPSY Right    Benign   COLONOSCOPY WITH PROPOFOL N/A 11/29/2020   Procedure: COLONOSCOPY WITH PROPOFOL;  Surgeon: Toney Reil, MD;  Location: [facility];   Service: Gastroenterology;  Laterality: N/A;   LAPAROSCOPY  1992   endometriosis   MYOMECTOMY  2007   removed    Family History  Problem Relation Age of Onset   Eczema Mother    Thyroid nodules Mother    Coronary artery disease Maternal Grandmother    Eczema Maternal Grandfather    Coronary artery disease Paternal Grandmother    Cancer Paternal Grandfather        throat cancer   Colon cancer Neg Hx    Breast cancer Neg Hx     Social History   Socioeconomic History   Marital status: Married    Spouse name: Not on file   Number of children: 2   Years of education: Not on file   Highest education level: Not on file  Occupational History   Occupation: revenue Buyer, retail: [employer]  Tobacco Use   Smoking status: Former    Current packs/day: 0.00    Types: Cigarettes    Quit date: 08/28/2006    Years since quitting: 16.2   Smokeless tobacco: Never  Vaping Use   Vaping status: Never Used  Substance and Sexual Activity   Alcohol use: Yes    Comment: occ   Drug use: No   Sexual activity: Yes  Birth control/protection: Post-menopausal  Other Topics Concern   Not on file  Social History Narrative   Adopted 2 children in 2012            Social Determinants of Health   Financial Resource Strain: Not on file  Food Insecurity: Not on file  Transportation Needs: Not on file  Physical Activity: Not on file  Stress: Not on file  Social Connections: Not on file  Intimate Partner Violence: Not on file   Review of Systems  Constitutional:  Negative for fatigue.       Wears sat belt  HENT:  Positive for tinnitus. Negative for dental problem and hearing loss.        Keeps up with dentist  Eyes:  Negative for visual disturbance.       No diplopia or unilateral vision loss  Respiratory:  Negative for cough, chest tightness and shortness of breath.   Cardiovascular:  Negative for chest pain and leg swelling.       Some palpitations with stress   Gastrointestinal:  Negative for blood in stool and constipation.       No heartburn  Endocrine: Negative for polydipsia and polyuria.  Genitourinary:  Negative for difficulty urinating, dyspareunia, dysuria and hematuria.  Musculoskeletal:  Positive for arthralgias. Negative for joint swelling.  Skin:  Negative for rash.       Did have lesion removed from arm--not cancer  Allergic/Immunologic: Negative for environmental allergies and immunocompromised state.  Neurological:  Negative for dizziness, syncope, light-headedness and headaches.  Hematological:  Negative for adenopathy. Does not bruise/bleed easily.  Psychiatric/Behavioral:  Negative for dysphoric mood. The patient is not nervous/anxious.        Chronic sleep problems--no change No sig daytime somnolence       Objective:   Physical Exam Constitutional:      Appearance: Normal appearance.  HENT:     Mouth/Throat:     Pharynx: No oropharyngeal exudate or posterior oropharyngeal erythema.  Eyes:     Conjunctiva/sclera: Conjunctivae normal.     Pupils: Pupils are equal, round, and reactive to light.  Cardiovascular:     Rate and Rhythm: Normal rate and regular rhythm.     Pulses: Normal pulses.     Heart sounds: No murmur heard.    No gallop.  Pulmonary:     Effort: Pulmonary effort is normal.     Breath sounds: Normal breath sounds. No wheezing or rales.  Abdominal:     Palpations: Abdomen is soft.     Tenderness: There is no abdominal tenderness.  Musculoskeletal:     Cervical back: Neck supple.     Right lower leg: No edema.     Left lower leg: No edema.  Lymphadenopathy:     Cervical: No cervical adenopathy.  Skin:    Findings: No rash.  Neurological:     General: No focal deficit present.     Mental Status: She is alert and oriented to person, place, and time.  Psychiatric:        Mood and Affect: Mood normal.        Behavior: Behavior normal.            Assessment & Plan:

## 2022-12-11 LAB — COMPREHENSIVE METABOLIC PANEL
ALT: 22 [IU]/L (ref 0–32)
AST: 23 [IU]/L (ref 0–40)
Albumin: 4.6 g/dL (ref 3.8–4.9)
Alkaline Phosphatase: 73 [IU]/L (ref 44–121)
BUN/Creatinine Ratio: 17 (ref 9–23)
BUN: 12 mg/dL (ref 6–24)
Bilirubin Total: 0.7 mg/dL (ref 0.0–1.2)
CO2: 21 mmol/L (ref 20–29)
Calcium: 9.5 mg/dL (ref 8.7–10.2)
Chloride: 102 mmol/L (ref 96–106)
Creatinine, Ser: 0.69 mg/dL (ref 0.57–1.00)
Globulin, Total: 2.9 g/dL (ref 1.5–4.5)
Glucose: 101 mg/dL — ABNORMAL HIGH (ref 70–99)
Potassium: 4.3 mmol/L (ref 3.5–5.2)
Sodium: 140 mmol/L (ref 134–144)
Total Protein: 7.5 g/dL (ref 6.0–8.5)
eGFR: 103 mL/min/{1.73_m2} (ref >59)

## 2022-12-11 LAB — CBC
Hematocrit: 41.5 % (ref 34.0–46.6)
Hemoglobin: 14.1 g/dL (ref 11.1–15.9)
MCH: 33 pg (ref 26.6–33.0)
MCHC: 34 g/dL (ref 31.5–35.7)
MCV: 97 fL (ref 79–97)
Platelets: 287 10*3/uL (ref 150–450)
RBC: 4.27 x10E6/uL (ref 3.77–5.28)
RDW: 12.3 % (ref 11.7–15.4)
WBC: 5.2 10*3/uL (ref 3.4–10.8)

## 2022-12-11 LAB — LIPID PANEL
Chol/HDL Ratio: 1.7 ratio (ref 0.0–4.4)
Cholesterol, Total: 241 mg/dL — ABNORMAL HIGH (ref 100–199)
HDL: 145 mg/dL (ref >39)
LDL Chol Calc (NIH): 81 mg/dL (ref 0–99)
Triglycerides: 92 mg/dL (ref 0–149)
VLDL Cholesterol Cal: 15 mg/dL (ref 5–40)

## 2023-02-20 ENCOUNTER — Ambulatory Visit: Payer: Managed Care, Other (non HMO) | Admitting: Internal Medicine

## 2023-03-19 ENCOUNTER — Ambulatory Visit (INDEPENDENT_AMBULATORY_CARE_PROVIDER_SITE_OTHER): Payer: Self-pay | Admitting: Dermatology

## 2023-03-19 DIAGNOSIS — L988 Other specified disorders of the skin and subcutaneous tissue: Secondary | ICD-10-CM

## 2023-03-19 DIAGNOSIS — L814 Other melanin hyperpigmentation: Secondary | ICD-10-CM

## 2023-03-19 DIAGNOSIS — L821 Other seborrheic keratosis: Secondary | ICD-10-CM

## 2023-03-19 DIAGNOSIS — L578 Other skin changes due to chronic exposure to nonionizing radiation: Secondary | ICD-10-CM

## 2023-03-19 DIAGNOSIS — I781 Nevus, non-neoplastic: Secondary | ICD-10-CM

## 2023-03-19 DIAGNOSIS — W908XXA Exposure to other nonionizing radiation, initial encounter: Secondary | ICD-10-CM

## 2023-03-19 NOTE — Progress Notes (Signed)
 Follow-Up Visit   Subjective  Madeline Reid is a 53 y.o. female who presents for the following: BBL treatment to face She has seen some improvement after first treatment and is pleased.  The patient has spots, moles and lesions to be evaluated, some may be new or changing and the patient may have concern these could be cancer.  The following portions of the chart were reviewed this encounter and updated as appropriate: medications, allergies, medical history  Review of Systems:  No other skin or systemic complaints except as noted in HPI or Assessment and Plan.  Objective  Well appearing patient in no apparent distress; mood and affect are within normal limits.   A focused examination was performed of the following areas: face  Relevant exam findings are noted in the Assessment and Plan.        Assessment & Plan   Telangiectasias; Lentigos; Actinic Changes and early Sks of FACE Face   Exam: dilated blood vessels on the cheeks and nose   Plan:  BBL treatment today   Photorejuvenation - face Prior to the procedure, the patient's past medical history, medications, allergies, and the rare but potential risks and complications were reviewed with the patient and a signed consent was obtained.  Pre and post treatment care was discussed and instructions provided.   [place] BBL - 03/19/23 1400      Patient Details   Skin Type: I    Anesthestic Cream Applied: No    Photo Takes: Yes    Consent Signed: Yes    Improvement from Previous Treatment: Yes      Treatment Details   Date: 03/19/23    Treatment #: 2    Area: F    Filter: 1st Pass;2nd Pass      1st Pass   Location: F    Device: 560 Filter   nose, cheeks, chin, upper lip, (reds)   BBL j/cm2: 27    PW Msec Sec: 27    Target Temp: 20    Pulses: 82    7mm: this one     Laser safety: Patient was advised in laser safety.  Patient was fitted with laser safety goggles and advised to keep eyes closed during  procedure with goggles on. Staff and provider ensured that patient and their own safety goggles were also on and eyes protected during procedure. Laser room door was secured and locked from the inside. Laser room door has laser safety sign affixed to the outside of the door.    Patient tolerated the procedure well.    Wynelle Link avoidance was stressed. The patient will call with any problems, questions or concerns prior to their next appointment.   Lentigo Face  Exam: scattered tan macules   Plan:  BBL treatment today    Photorejuvenation - face Prior to the procedure, the patient's past medical history, medications, allergies, and the rare but potential risks and complications were reviewed with the patient and a signed consent was obtained.  Pre and post treatment care was discussed and instructions provided.     [place] BBL - 03/19/23 1400      Patient Details   Skin Type: I    Anesthestic Cream Applied: No    Photo Takes: Yes    Consent Signed: Yes    Improvement from Previous Treatment: Yes      Treatment Details   Date: 03/19/23    Treatment #: 2    Area: F    Filter: 1st Pass;2nd  Pass      1st Pass   Location: F    Device: 560 Filter   nose, cheeks, chin, upper lip, (reds)   BBL j/cm2: 27    PW Msec Sec: 27    Target Temp: 20    Pulses: 82    7mm: this one     Laser safety: Patient was advised in laser safety.  Patient was fitted with laser safety goggles and advised to keep eyes closed during procedure with goggles on. Staff and provider ensured that patient and their own safety goggles were also on and eyes protected during procedure. Laser room door was secured and locked from the inside. Laser room door has laser safety sign affixed to the outside of the door.    Patient tolerated the procedure well.    Wynelle Link avoidance was stressed. The patient will call with any problems, questions or concerns prior to their next appointment.   SEBORRHEIC KERATOSIS -  Stuck-on, waxy, tan-brown papules and/or plaques  - Benign-appearing - Discussed benign etiology and prognosis. - Observe - Call for any changes   ACTINIC DAMAGE - chronic, secondary to cumulative UV radiation exposure/sun exposure over time - diffuse scaly erythematous macules with underlying dyspigmentation - Recommend daily broad spectrum sunscreen SPF 30+ to sun-exposed areas, reapply every 2 hours as needed.  - Recommend staying in the shade or wearing long sleeves, sun glasses (UVA+UVB protection) and wide brim hats (4-inch brim around the entire circumference of the hat). - Call for new or changing lesions.    No follow-ups on file.  IAsher Muir, CMA, am acting as scribe for Armida Sans, MD.   Documentation: I have reviewed the above documentation for accuracy and completeness, and I agree with the above.  Armida Sans, MD

## 2023-03-19 NOTE — Patient Instructions (Signed)

## 2023-03-23 ENCOUNTER — Encounter: Payer: Self-pay | Admitting: Dermatology

## 2023-05-07 ENCOUNTER — Ambulatory Visit (INDEPENDENT_AMBULATORY_CARE_PROVIDER_SITE_OTHER)

## 2023-05-07 DIAGNOSIS — Z23 Encounter for immunization: Secondary | ICD-10-CM | POA: Diagnosis not present

## 2023-05-07 NOTE — Progress Notes (Signed)
 Per orders of Dr. Tillman Abide, injection of Shingles  given by Nanci Pina in left deltoid. Patient tolerated injection well. Patient will make appointment for 2- 6 months for 2nd dose.

## 2023-05-08 ENCOUNTER — Ambulatory Visit: Admitting: Internal Medicine

## 2023-06-30 ENCOUNTER — Ambulatory Visit (INDEPENDENT_AMBULATORY_CARE_PROVIDER_SITE_OTHER): Payer: Managed Care, Other (non HMO) | Admitting: Dermatology

## 2023-06-30 ENCOUNTER — Encounter: Payer: Self-pay | Admitting: Dermatology

## 2023-06-30 DIAGNOSIS — L821 Other seborrheic keratosis: Secondary | ICD-10-CM

## 2023-06-30 DIAGNOSIS — L814 Other melanin hyperpigmentation: Secondary | ICD-10-CM

## 2023-06-30 DIAGNOSIS — W908XXA Exposure to other nonionizing radiation, initial encounter: Secondary | ICD-10-CM

## 2023-06-30 DIAGNOSIS — I781 Nevus, non-neoplastic: Secondary | ICD-10-CM

## 2023-06-30 DIAGNOSIS — L578 Other skin changes due to chronic exposure to nonionizing radiation: Secondary | ICD-10-CM

## 2023-06-30 NOTE — Patient Instructions (Signed)

## 2023-06-30 NOTE — Progress Notes (Signed)
 Follow-Up Visit   Subjective  Madeline Reid is a 53 y.o. female who presents for the following: patient here for 3rd laser treatment on her face   The following portions of the chart were reviewed this encounter and updated as appropriate: medications, allergies, medical history  Review of Systems:  No other skin or systemic complaints except as noted in HPI or Assessment and Plan.  Objective  Well appearing patient in no apparent distress; mood and affect are within normal limits.  A focused examination was performed of the following areas: face  Relevant exam findings are noted in the Assessment and Plan.         Assessment & Plan   Telangiectasias: lentigos; Actinic Changes and early Sks of FACE  Exam: dilated blood vessels on the cheeks and nose Treatment Plan: BBL today  Laser safety: Patient was advised in laser safety.  Patient was fitted with laser safety goggles and advised to keep eyes closed during procedure with goggles on. Staff and provider ensured that patient and their own safety goggles were also on and eyes protected during procedure. Laser room door was secured and locked from the inside. Laser room door has laser safety sign affixed to the outside of the door.    [place] BBL - 06/30/23 1800      Patient Details   Skin Type: II    Anesthestic Cream Applied: No    Photo Takes: Yes    Consent Signed: Yes    Improvement from Previous Treatment: Yes      Treatment Details   Date: 06/30/23    Treatment #: 3    Area: f    Filter: 1st Pass;2nd Pass      1st Pass   Location: F   Nose, Chin, and upper lip   Device: 560   Reds at nose, chin and upper lip   BBL j/cm2: 27    PW Msec Sec: 27    Cooling Temp: 20    Pulses: 76    7mm: this one     Patient tolerated the procedure well.    Paulene Boron avoidance was stressed. The patient will call with any problems, questions or concerns prior to their next appointment.   LENTIGINES Exam: scattered  tan macules Due to sun exposure Treatment Plan: BBL today   Laser safety: Patient was advised in laser safety.  Patient was fitted with laser safety goggles and advised to keep eyes closed during procedure with goggles on. Staff and provider ensured that patient and their own safety goggles were also on and eyes protected during procedure. Laser room door was secured and locked from the inside. Laser room door has laser safety sign affixed to the outside of the door.     [place] BBL - 06/30/23 1800      Patient Details   Skin Type: II    Anesthestic Cream Applied: No    Photo Takes: Yes    Consent Signed: Yes    Improvement from Previous Treatment: Yes      Treatment Details   Date: 06/30/23    Treatment #: 3    Area: f    Filter: 1st Pass;2nd Pass        2nd Pass   Location: F   cheeks, temples and forehead   Device: 515   pigmented lesions on cheeks, temples, and forehead   BBL j/cm2: 12    PW Msec Sec: 10    Cooling Temp: 25  Pulses: 134    11mm: this one    Patient tolerated the procedure well.   Paulene Boron avoidance was stressed. The patient will call with any problems, questions or concerns prior to their next appointment.   ACTINIC DAMAGE - chronic, secondary to cumulative UV radiation exposure/sun exposure over time - diffuse scaly erythematous macules with underlying dyspigmentation - Recommend daily broad spectrum sunscreen SPF 30+ to sun-exposed areas, reapply every 2 hours as needed.  - Recommend staying in the shade or wearing long sleeves, sun glasses (UVA+UVB protection) and wide brim hats (4-inch brim around the entire circumference of the hat). - Call for new or changing lesions.    Return for keep followup as scheduled.  IRandee Busing, CMA, am acting as scribe for Celine Collard, MD.    Documentation: I have reviewed the above documentation for accuracy and completeness, and I agree with the above.  Celine Collard, MD

## 2023-07-01 ENCOUNTER — Ambulatory Visit: Payer: Managed Care, Other (non HMO) | Admitting: Dermatology

## 2023-07-01 ENCOUNTER — Encounter: Payer: Self-pay | Admitting: Dermatology

## 2023-07-01 DIAGNOSIS — Z86018 Personal history of other benign neoplasm: Secondary | ICD-10-CM | POA: Diagnosis not present

## 2023-07-01 DIAGNOSIS — Z7189 Other specified counseling: Secondary | ICD-10-CM

## 2023-07-01 MED ORDER — VALACYCLOVIR HCL 500 MG PO TABS
ORAL_TABLET | ORAL | 2 refills | Status: AC
Start: 2023-07-01 — End: ?

## 2023-07-01 NOTE — Patient Instructions (Addendum)
 Take valacyclovir 500 mg tablet by mouth with a glass of water twice a day starting 2 days before procedure and continuing for 10 days total, or take 4 tablets at first sign of symptoms, repeat in 12 hours for 1 total dose .    Recommend daily broad spectrum sunscreen SPF 30+ to sun-exposed areas, reapply every 2 hours as needed. Call for new or changing lesions.  Staying in the shade or wearing long sleeves, sun glasses (UVA+UVB protection) and wide brim hats (4-inch brim around the entire circumference of the hat) are also recommended for sun protection.     Due to recent changes in healthcare laws, you may see results of your pathology and/or laboratory studies on MyChart before the doctors have had a chance to review them. We understand that in some cases there may be results that are confusing or concerning to you. Please understand that not all results are received at the same time and often the doctors may need to interpret multiple results in order to provide you with the best plan of care or course of treatment. Therefore, we ask that you please give us  2 business days to thoroughly review all your results before contacting the office for clarification. Should we see a critical lab result, you will be contacted sooner.   If You Need Anything After Your Visit  If you have any questions or concerns for your doctor, please call our main line at [phone] and press option 4 to reach your doctor's medical assistant. If no one answers, please leave a voicemail as directed and we will return your call as soon as possible. Messages left after 4 pm will be answered the following business day.   You may also send us  a message via MyChart. We typically respond to MyChart messages within 1-2 business days.  For prescription refills, please ask your pharmacy to contact our office. Our fax number is [phone].  If you have an urgent issue when the clinic is closed that cannot wait until the next  business day, you can page your doctor at the number below.    Please note that while we do our best to be available for urgent issues outside of office hours, we are not available 24/7.   If you have an urgent issue and are unable to reach us , you may choose to seek medical care at your doctor's office, retail clinic, urgent care center, or emergency room.  If you have a medical emergency, please immediately call 911 or go to the emergency department.  Pager Numbers  - Dr. Bary Likes: [phone]  - Dr. Annette Barters: [phone]  - Dr. Felipe Horton: [phone]   In the event of inclement weather, please call our main line at [phone] for an update on the status of any delays or closures.  Dermatology Medication Tips: Please keep the boxes that topical medications come in in order to help keep track of the instructions about where and how to use these. Pharmacies typically print the medication instructions only on the boxes and not directly on the medication tubes.   If your medication is too expensive, please contact our office at [phone] option 4 or send us  a message through MyChart.   We are unable to tell what your co-pay for medications will be in advance as this is different depending on your insurance coverage. However, we may be able to find a substitute medication at lower cost or fill out paperwork to get insurance to cover a needed medication.  If a prior authorization is required to get your medication covered by your insurance company, please allow us  1-2 business days to complete this process.  Drug prices often vary depending on where the prescription is filled and some pharmacies may offer cheaper prices.  The website www.goodrx.com contains coupons for medications through different pharmacies. The prices here do not account for what the cost may be with help from insurance (it may be cheaper with your insurance), but the website can give you the price if you did not use  any insurance.  - You can print the associated coupon and take it with your prescription to the pharmacy.  - You may also stop by our office during regular business hours and pick up a GoodRx coupon card.  - If you need your prescription sent electronically to a different pharmacy, notify our office through [facility] or by phone at [phone] option 4.     Si Usted Necesita Algo Despus de Su Visita  Tambin puede enviarnos un mensaje a travs de Clinical cytogeneticist. Por lo general respondemos a los mensajes de MyChart en el transcurso de 1 a 2 das hbiles.  Para renovar recetas, por favor pida a su farmacia que se ponga en contacto con nuestra oficina. Franz Jacks de fax es [place] [phone].  Si tiene un asunto urgente cuando la clnica est cerrada y que no puede esperar hasta el siguiente da hbil, puede llamar/localizar a su doctor(a) al nmero que aparece a continuacin.   Por favor, tenga en cuenta que aunque hacemos todo lo posible para estar disponibles para asuntos urgentes fuera del horario de [place], no estamos disponibles las 24 horas del da, los 7 [address]  [address] de la [place].   Si tiene un problema urgente y no puede comunicarse con nosotros, puede optar por buscar atencin mdica  en el consultorio de su doctor(a), en una clnica privada, en un centro de atencin urgente o en una sala de emergencias.  Si tiene Engineer, drilling, por favor llame inmediatamente al 911 o vaya a la sala de emergencias.  Nmeros de bper  - Dr. Bary Likes: [phone]  - Dra. Annette Barters: [phone]  - Dr. Felipe Horton: [phone]   En caso de inclemencias del tiempo, por favor llame a Lajuan Pila principal al [phone] para una actualizacin sobre el [place] de cualquier retraso o cierre.  Consejos para la medicacin en dermatologa: Por favor, guarde las cajas en las que vienen los medicamentos de uso tpico para ayudarle a seguir las instrucciones sobre dnde y cmo usarlos. Las farmacias  generalmente imprimen las instrucciones del medicamento slo en las cajas y no directamente en los tubos del [place].   Si su medicamento es muy caro, por favor, pngase en contacto con Bettyjane Brunet llamando al [phone] y presione la opcin 4 o envenos un mensaje a travs de Clinical cytogeneticist.   No podemos decirle cul ser su copago por los medicamentos por adelantado ya que esto es diferente dependiendo de la cobertura de su seguro. Sin embargo, es posible que podamos encontrar un medicamento sustituto a Audiological scientist un formulario para que el seguro cubra el medicamento que se considera necesario.   Si se requiere una autorizacin previa para que su compaa de seguros [place] su medicamento, por favor permtanos de 1 a 2 das hbiles para completar este proceso.  Los precios de los medicamentos varan con frecuencia dependiendo del Environmental consultant de dnde se surte la receta y alguna farmacias pueden ofrecer precios ms baratos.  El sitio web  www.goodrx.com tiene cupones para medicamentos de Health and safety inspector. Los precios aqu no tienen en cuenta lo que podra costar con la ayuda del seguro (puede ser ms barato con su seguro), pero el sitio web puede darle el precio si no utiliz Tourist information centre manager.  - Puede imprimir el cupn correspondiente y llevarlo con su receta a la farmacia.  - Tambin puede pasar por nuestra oficina durante el horario de atencin regular y Education officer, museum una tarjeta de cupones de GoodRx.  - Si necesita que su receta se enve electrnicamente a una farmacia diferente, informe a nuestra oficina a travs de MyChart de [place] o por telfono llamando al [phone] y presione la opcin 4.

## 2023-07-01 NOTE — Progress Notes (Signed)
   Follow-Up Visit   Subjective  Madeline Reid is a 53 y.o. female who presents for the following: recheck biopsy site, right posterior upper arm/deltoid. Hx of dysplastic nevus with severe atypia, margins clear. Bx 10/29/2022.  The patient has spots, moles and lesions to be evaluated, some may be new or changing and the patient may have concern these could be cancer.  The following portions of the chart were reviewed this encounter and updated as appropriate: medications, allergies, medical history  Review of Systems:  No other skin or systemic complaints except as noted in HPI or Assessment and Plan.  Objective  Well appearing patient in no apparent distress; mood and affect are within normal limits.  A focused examination was performed of the following areas: Right upper arm/deltoid  Relevant physical exam findings are noted in the Assessment and Plan.        Assessment & Plan   HISTORY OF DYSPLASTIC NEVUS. Right posterior deltoid, severe, margins free. Shave Removal 10/29/2022.  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 SEE COMMENTS.   Severe dysplastic Pathologist comments that margins clear May need additional procedure Schedule appt for 6 mos for re-check  No evidence of recurrence today Recommend regular full body skin exams Recommend daily broad spectrum sunscreen SPF 30+ to sun-exposed areas, reapply every 2 hours as needed.  Call if any new or changing lesions are noted between office visits Discussed option of Observation, since "margins free" vs excision.  I recommend observation and will plan procedure in future if evidence of recurrence. Pt understands and agrees.    Pt had BBL of face yesterday with me and doing  well. Counseling for BBL / IPL / Laser and Coordination of Care Discussed the treatment option of Broad Band Light (BBL) /Intense Pulsed Light (IPL)/ Laser for skin discoloration, including brown spots and redness.  Typically we recommend at least 1-3 treatment sessions about 5-8 weeks apart for best results.  Cannot have tanned skin when BBL performed, and regular use of sunscreen/photoprotection is advised after the procedure to help maintain results. The patient's condition may also require "maintenance treatments" in the future.  The fee for BBL / laser treatments is $350 per treatment session for the whole face.  A fee can be quoted for other parts of the body.  Insurance typically does not pay for BBL/laser treatments and therefore the fee is an out-of-pocket cost. Recommend prophylactic valtrex treatment. Once scheduled for procedure, will send Rx in prior to patient's appointment.   Take valacyclovir 500 mg tablet by mouth with a glass of water twice a day starting 2 days before procedure and continuing for 10 days total.  For lesion not related to laser: take 4 tablets at first sign of symptoms, repeat in 12 hours for 1 total dose .     Return for TBSE As Scheduled, With Dr. Bary Likes.  I, Jill Parcell, CMA, am acting as scribe for Celine Collard, MD.   Documentation: I have reviewed the above documentation for accuracy and completeness, and I agree with the above.  Celine Collard, MD

## 2023-08-06 ENCOUNTER — Ambulatory Visit (INDEPENDENT_AMBULATORY_CARE_PROVIDER_SITE_OTHER)

## 2023-08-06 DIAGNOSIS — Z23 Encounter for immunization: Secondary | ICD-10-CM

## 2023-08-06 DIAGNOSIS — E538 Deficiency of other specified B group vitamins: Secondary | ICD-10-CM

## 2023-08-06 MED ORDER — CYANOCOBALAMIN 1000 MCG/ML IJ SOLN: 1000.0000 ug | Freq: Once | INTRAMUSCULAR | Status: DC

## 2023-08-06 NOTE — Addendum Note (Signed)
 Addended by: TENNIE LARAY HERO on: 08/06/2023 12:49 PM   Modules accepted: Orders

## 2023-08-06 NOTE — Progress Notes (Addendum)
 Per orders of Dr. Charlie Denise, injection of shingrix  given by Laray Arenas in left deltoid. Patient tolerated injection well.

## 2023-08-06 NOTE — Addendum Note (Signed)
 Addended by: SEBASTIAN DANNA GRADE on: 08/06/2023 12:36 PM   Modules accepted: Orders

## 2023-09-29 ENCOUNTER — Other Ambulatory Visit: Payer: Self-pay | Admitting: Family

## 2023-09-29 DIAGNOSIS — E079 Disorder of thyroid, unspecified: Secondary | ICD-10-CM

## 2023-10-05 ENCOUNTER — Ambulatory Visit

## 2023-11-04 ENCOUNTER — Ambulatory Visit: Payer: Managed Care, Other (non HMO) | Admitting: Dermatology

## 2023-12-11 ENCOUNTER — Encounter: Payer: Managed Care, Other (non HMO) | Admitting: Family

## 2024-01-18 ENCOUNTER — Other Ambulatory Visit: Payer: Self-pay

## 2024-01-18 MED ORDER — LOSARTAN POTASSIUM 50 MG PO TABS: 50.0000 mg | ORAL_TABLET | Freq: Every day | ORAL | 0 refills | Status: AC

## 2024-01-18 NOTE — Telephone Encounter (Signed)
 Pt needs to reschedule transfer of care. Last OV 11-2022. Losartan  filled for 90 days.

## 2024-01-19 NOTE — Telephone Encounter (Signed)
 Lvm to reschedule TOC with Tabitha

## 2024-04-05 ENCOUNTER — Encounter: Admitting: Family

## 2024-04-21 ENCOUNTER — Ambulatory Visit: Admitting: Dermatology
